# Patient Record
Sex: Male | Born: 1965 | Race: White | Hispanic: No | Marital: Married | Smoking: Former smoker
Health system: Southern US, Community
[De-identification: ages and names within clinical notes are randomized; demographics above are authoritative.]

## PROBLEM LIST (undated history)

## (undated) ENCOUNTER — Inpatient Hospital Stay (HOSPITAL_COMMUNITY): Payer: BC Managed Care – PPO

## (undated) DIAGNOSIS — J45909 Unspecified asthma, uncomplicated: Secondary | ICD-10-CM

## (undated) DIAGNOSIS — N400 Enlarged prostate without lower urinary tract symptoms: Secondary | ICD-10-CM

## (undated) DIAGNOSIS — Z87442 Personal history of urinary calculi: Secondary | ICD-10-CM

## (undated) DIAGNOSIS — N289 Disorder of kidney and ureter, unspecified: Secondary | ICD-10-CM

## (undated) DIAGNOSIS — G629 Polyneuropathy, unspecified: Secondary | ICD-10-CM

## (undated) DIAGNOSIS — I4891 Unspecified atrial fibrillation: Secondary | ICD-10-CM

## (undated) DIAGNOSIS — I4819 Other persistent atrial fibrillation: Secondary | ICD-10-CM

## (undated) DIAGNOSIS — E669 Obesity, unspecified: Secondary | ICD-10-CM

## (undated) HISTORY — DX: Polyneuropathy, unspecified: G62.9

## (undated) HISTORY — DX: Benign prostatic hyperplasia without lower urinary tract symptoms: N40.0

## (undated) HISTORY — PX: COLONOSCOPY: SHX174

## (undated) HISTORY — PX: STENT PLACE LEFT URETER (ARMC HX): HXRAD1254

## (undated) HISTORY — PX: LITHOTRIPSY: SUR834

## (undated) HISTORY — DX: Unspecified asthma, uncomplicated: J45.909

## (undated) HISTORY — DX: Other persistent atrial fibrillation: I48.19

## (undated) HISTORY — DX: Obesity, unspecified: E66.9

---

## 1998-02-17 ENCOUNTER — Ambulatory Visit (HOSPITAL_COMMUNITY): Admission: RE | Admit: 1998-02-17 | Discharge: 1998-02-17 | Payer: Self-pay | Admitting: Urology

## 2000-06-13 ENCOUNTER — Ambulatory Visit (HOSPITAL_COMMUNITY): Admission: RE | Admit: 2000-06-13 | Discharge: 2000-06-13 | Payer: Self-pay | Admitting: Urology

## 2000-06-13 ENCOUNTER — Encounter: Payer: Self-pay | Admitting: Urology

## 2002-11-05 ENCOUNTER — Ambulatory Visit (HOSPITAL_BASED_OUTPATIENT_CLINIC_OR_DEPARTMENT_OTHER): Admission: RE | Admit: 2002-11-05 | Discharge: 2002-11-05 | Payer: Self-pay | Admitting: Urology

## 2002-11-05 ENCOUNTER — Encounter: Payer: Self-pay | Admitting: Urology

## 2003-06-26 ENCOUNTER — Observation Stay (HOSPITAL_COMMUNITY): Admission: AD | Admit: 2003-06-26 | Discharge: 2003-06-27 | Payer: Self-pay | Admitting: Urology

## 2003-06-29 ENCOUNTER — Emergency Department (HOSPITAL_COMMUNITY): Admission: EM | Admit: 2003-06-29 | Discharge: 2003-06-29 | Payer: Self-pay | Admitting: Emergency Medicine

## 2003-06-30 ENCOUNTER — Ambulatory Visit (HOSPITAL_BASED_OUTPATIENT_CLINIC_OR_DEPARTMENT_OTHER): Admission: RE | Admit: 2003-06-30 | Discharge: 2003-06-30 | Payer: Self-pay | Admitting: Urology

## 2003-07-05 ENCOUNTER — Ambulatory Visit (HOSPITAL_COMMUNITY): Admission: RE | Admit: 2003-07-05 | Discharge: 2003-07-05 | Payer: Self-pay | Admitting: Urology

## 2008-03-27 ENCOUNTER — Emergency Department (HOSPITAL_COMMUNITY): Admission: EM | Admit: 2008-03-27 | Discharge: 2008-03-28 | Payer: Self-pay | Admitting: Emergency Medicine

## 2008-03-29 ENCOUNTER — Inpatient Hospital Stay (HOSPITAL_COMMUNITY): Admission: EM | Admit: 2008-03-29 | Discharge: 2008-03-29 | Payer: Self-pay | Admitting: Emergency Medicine

## 2008-04-01 ENCOUNTER — Ambulatory Visit (HOSPITAL_COMMUNITY): Admission: RE | Admit: 2008-04-01 | Discharge: 2008-04-01 | Payer: Self-pay | Admitting: Urology

## 2011-03-06 NOTE — Op Note (Signed)
Dennis [name], Dennis [name]               ACCOUNT NO.:  [id number]   MEDICAL RECORD NO.:  [id number]          PATIENT TYPE:  INP   LOCATION:  1411                         FACILITY:  [facility]   PHYSICIAN:  Mark C. Vernie Ammons, M.D.  DATE OF BIRTH:  1966-01-15   DATE OF PROCEDURE:  03/29/2008  DATE OF DISCHARGE:                               OPERATIVE REPORT   PREOPERATIVE DIAGNOSIS:  Left ureteral calculus.   POSTOPERATIVE DIAGNOSIS:  Left ureteral calculus.   PROCEDURE:  1. Cystoscopy.  2. Left retrograde pyelogram with interpretation.  3. Left double-J stent placement.   SURGEON:  Mark C. Vernie Ammons, M.D.   ANESTHESIA:  General.   BLOOD LOSS:  None.   SPECIMENS:  None.   DRAINS:  5-French 24-cm Polaris stent (with string).   COMPLICATIONS:  None.   INDICATIONS:  The patient is a 45 year old white male who was admitted  to the hospital last night with severe left renal colic and was found to  have a large left ureteral calculus.  He was brought to the operating  room today for a double-J stent placement since he had had recent Motrin  and could not undergo lithotripsy today.  The risks, complications and  alternatives were discussed, and the patient understands and elected to  proceed.   DESCRIPTION OF OPERATION:  After informed consent, the patient was  brought to the major OR, placed on the table, administered general  anesthesia, and then moved to the dorsal lithotomy position.  His  genitalia was then sterilely prepped and draped.  An official time-out  was then performed.   The 22-French cystoscope was then introduced per urethra under direct  vision.  The urethra was noted be normal down to the sphincter which  appears intact.  The prostatic urethra revealed no lesions.  Upon  entering the bladder, the bladder was fully and systematically inspected  and noted be free of any tumor, stones or inflammatory lesions.  The  ureteral orifices were of normal configuration and  position.   A 6-French open-ended ureteral catheter was then passed through the  cystoscope and into the left ureteral orifice.  Under direct  fluoroscopy, a retrograde pyelogram was then performed by injecting full-  strength contrast through the open-ended stent, and under direct  fluoroscopy this was visualized as it progressed up the left ureter  without abnormality until it reached an area just below the UPJ.  At  that location, the linear filling defect consistent with the stone seen  on previous CT was identified.  There was mild dilatation of the  intrarenal collecting system, but no stones were noted within the kidney  nor were there any filling defects or masses nor mass effect.   Through the open-ended catheter, a 0.038-inch floppy tip guidewire was  then passed under direct fluoroscopy into the area of the renal pelvis  and the open-ended stent removed.  The stent was then passed over the  guidewire into the area of the renal pelvis, and as the guidewire was  removed, good curl was noted in the renal pelvis.  The bladder was  drained,  and the string attached to the distal aspect of the stent was  then affixed to the dorsum of the penis.   The patient was awakened and taken to the recovery room in stable  satisfactory condition.  He tolerated procedure well.  There were no  intraoperative complications.   He will be given a prescription for Tylox, #36, for pain, Pyridium Plus,  #28, for bladder irritation, and Cipro 250 mg b.i.d., #14.  He will be  scheduled for lithotripsy on Thursday by Dr. Laverle Patter, and the patient had  requested that his stent be removed if the stone appeared to have broken  up completely.  That is why I left the tether on the stent.      Mark C. Vernie Ammons, M.D.  Electronically Signed     MCO/MEDQ  D:  03/29/2008  T:  03/29/2008  Job:  [id number]

## 2011-03-09 NOTE — Op Note (Signed)
[name], Dennis [name]                           ACCOUNT NO.:  [id number]   MEDICAL RECORD NO.:  [id number]                   PATIENT TYPE:  EMS   LOCATION:  ED                                   FACILITY:  [facility]   PHYSICIAN:  Mark C. Vernie Ammons, M.D.               DATE OF BIRTH:  1966/01/11   DATE OF PROCEDURE:  06/30/2003  DATE OF DISCHARGE:                                 OPERATIVE REPORT   PREOPERATIVE DIAGNOSIS:  Right ureteral calculus.   POSTOPERATIVE DIAGNOSIS:  Right ureteral calculus.   OPERATION PERFORMED:  Cystoscopy, right retrograde pyelogram and double-J  stent placement.   SURGEON:  Mark C. Vernie Ammons, M.D.   ANESTHESIA:  General  LMA.   SPECIMENS:  None.   ESTIMATED BLOOD LOSS:  None.   DRAINS:  6 French 28 cm double-J stent in the right ureter with string.   COMPLICATIONS:  None.   INDICATIONS FOR PROCEDURE:  The patient is a 45 year old white male who was  recently diagnosed with a right ureteral calculus.  Over the past week he  has required three trips to the emergency room for parenteral pain control.  He continues to have intermittent severe pain, difficult to control with  oral narcotics.  He was brought to the operating room for stent placement  and preparation for lithotripsy.   DESCRIPTION OF PROCEDURE:  After informed consent, patient brought to the  major operating room, placed on the table, administered general anesthesia,  then moved to the dorsal lithotomy position after which his genitalia were  sterilely prepped and draped.  A 22 French cystoscope was introduced in the  urethra and a 12 degree lens inserted.  The urethra was normal down to the  sphincter which was intact.  Prostatic urethra was without lesion.  The  bladder itself was free of any tumor, stones or inflammatory lesion.   The right ureteral orifice was identified.  The guidewire was passed into  the distal ureter and the open ended ureteral catheter passed over this.  I  removed  the guidewire and performed retrograde pyelogram which revealed the  stone in the midureter with hydronephrosis.  The stone's location was at the  lower border of L3.  I then passed the guidewire back through the open ended  ureteral catheter and removed the open ended catheter and replaced that with  a double-J stent.  This was passed into the area of the renal pelvis and the  guidewire was removed with good curl being noted in the renal pelvis and  bladder.  I then drained the bladder, removed the cystoscope and instilled  2% lidocaine jelly  in the urethra and a B&O suppository and the patient was awakened and taken  to the recovery room in stable and satisfactory condition.  The patient  tolerated the procedure well.  No intraoperative complications.  He will be  given a prescription for Pyridium  Plus, #36 and will undergo lithotripsy in  five days.                                                Mark C. Vernie Ammons, M.D.    MCO/MEDQ  D:  06/30/2003  T:  06/30/2003  Job:  [id number]

## 2011-07-19 LAB — URINALYSIS, ROUTINE W REFLEX MICROSCOPIC
Bilirubin Urine: NEGATIVE
Glucose, UA: NEGATIVE
Leukocytes, UA: NEGATIVE
Nitrite: NEGATIVE
Protein, ur: NEGATIVE
Specific Gravity, Urine: 1.031 — ABNORMAL HIGH
Urobilinogen, UA: 0.2
pH: 5.5

## 2011-07-19 LAB — CBC
HCT: 48.8
Hemoglobin: 16.9
Platelets: 275
RBC: 5.24
WBC: 11.6 — ABNORMAL HIGH

## 2011-07-19 LAB — BASIC METABOLIC PANEL
CO2: 27
Chloride: 106
Creatinine, Ser: 1.25
GFR calc Af Amer: >60
Glucose, Bld: 112 — ABNORMAL HIGH

## 2011-07-19 LAB — DIFFERENTIAL
Eosinophils Relative: 1
Lymphocytes Relative: 29
Lymphs Abs: 3.3
Monocytes Relative: 11

## 2011-07-19 LAB — HEMOGLOBIN AND HEMATOCRIT, BLOOD: HCT: 38.3 — ABNORMAL LOW

## 2011-07-19 LAB — URINE MICROSCOPIC-ADD ON

## 2016-10-31 DIAGNOSIS — Z1389 Encounter for screening for other disorder: Secondary | ICD-10-CM | POA: Diagnosis not present

## 2016-10-31 DIAGNOSIS — R3129 Other microscopic hematuria: Secondary | ICD-10-CM | POA: Diagnosis not present

## 2016-10-31 DIAGNOSIS — M545 Low back pain: Secondary | ICD-10-CM | POA: Diagnosis not present

## 2016-11-26 DIAGNOSIS — J111 Influenza due to unidentified influenza virus with other respiratory manifestations: Secondary | ICD-10-CM | POA: Diagnosis not present

## 2017-03-23 ENCOUNTER — Encounter (HOSPITAL_COMMUNITY): Payer: Self-pay | Admitting: Oncology

## 2017-03-23 ENCOUNTER — Emergency Department (HOSPITAL_COMMUNITY)
Admission: EM | Admit: 2017-03-23 | Discharge: 2017-03-24 | Disposition: A | Discharge status: Home / Self Care | Payer: BLUE CROSS/BLUE SHIELD | Attending: Emergency Medicine | Admitting: Emergency Medicine

## 2017-03-23 DIAGNOSIS — F17290 Nicotine dependence, other tobacco product, uncomplicated: Secondary | ICD-10-CM | POA: Diagnosis not present

## 2017-03-23 DIAGNOSIS — R319 Hematuria, unspecified: Secondary | ICD-10-CM | POA: Insufficient documentation

## 2017-03-23 DIAGNOSIS — N201 Calculus of ureter: Secondary | ICD-10-CM | POA: Diagnosis not present

## 2017-03-23 DIAGNOSIS — K200 Eosinophilic esophagitis: Secondary | ICD-10-CM | POA: Diagnosis not present

## 2017-03-23 DIAGNOSIS — R109 Unspecified abdominal pain: Secondary | ICD-10-CM | POA: Diagnosis not present

## 2017-03-23 DIAGNOSIS — N202 Calculus of kidney with calculus of ureter: Secondary | ICD-10-CM | POA: Diagnosis not present

## 2017-03-23 HISTORY — DX: Disorder of kidney and ureter, unspecified: N28.9

## 2017-03-23 LAB — URINALYSIS, ROUTINE W REFLEX MICROSCOPIC
BACTERIA UA: NONE SEEN
Bilirubin Urine: NEGATIVE
GLUCOSE, UA: NEGATIVE mg/dL
KETONES UR: 5 mg/dL — AB
LEUKOCYTES UA: NEGATIVE
NITRITE: NEGATIVE
PH: 5 (ref 5.0–8.0)
PROTEIN: 30 mg/dL — AB
Specific Gravity, Urine: 1.026 (ref 1.005–1.030)

## 2017-03-23 MED ORDER — ONDANSETRON HCL 4 MG/2ML IJ SOLN
4.0000 mg | Freq: Once | INTRAMUSCULAR | Status: AC
  Administered 2017-03-23: 4 mg via INTRAVENOUS
  Filled 2017-03-23: qty 2

## 2017-03-23 MED ORDER — HYDROMORPHONE HCL 1 MG/ML IJ SOLN
0.5000 mg | Freq: Once | INTRAMUSCULAR | Status: AC
  Administered 2017-03-23: 0.5 mg via INTRAVENOUS
  Filled 2017-03-23: qty 1

## 2017-03-23 MED ORDER — KETOROLAC TROMETHAMINE 30 MG/ML IJ SOLN
30.0000 mg | Freq: Once | INTRAMUSCULAR | Status: AC
  Administered 2017-03-23: 30 mg via INTRAVENOUS
  Filled 2017-03-23: qty 1

## 2017-03-23 NOTE — ED Triage Notes (Signed)
Pt c/o left sided flank pain that became intense today. Per pt over the last two weeks he has had intermittent discomfort.  Pt w/ hx of obstructing kidney stones.  States that this feels similar.  Denies urinary sx.  +Nausea.  Pt rates his pain 3/10, dull constant ache.

## 2017-03-23 NOTE — ED Provider Notes (Signed)
[facility]-EMERGENCY DEPT Provider Note   CSN: [id number] Arrival date & time: 03/23/17  2039     History   Chief Complaint Chief Complaint  Patient presents with  . Flank Pain    HPI Dennis [name] is a 51 y.o. male.  The history is provided by the patient and medical records.  Flank Pain     [age] y.o. M with hx of kidney stones, presenting to the ED with left flank pain.  Patient reports he has had some intermittent left flank pain over the past 2 weeks. States he went to play golf today and pain got worse after he returned home. States pain was initially at a 3/10 but has increased to 6/10.  States pain mostly in the left flank, no significant radiation. Does report decreased urine output and has noticed some hematuria. He denies any dysuria. No nausea or vomiting currently. Does have a history of recurrent obstructive stones requiring stenting or basket retrieval. He is well-known to [facility], Dr. Vernie Ammonsttelin.  Past Medical History:  Diagnosis Date  . Renal disorder    kidney stones    There are no active problems to display for this patient.   Past Surgical History:  Procedure Laterality Date  . LITHOTRIPSY    . STENT PLACE LEFT URETER ([facility] HX) Bilateral        Home Medications    Prior to Admission medications   Not on File    Family History No family history on file.  Social History Social History  Substance Use Topics  . Smoking status: Current Some Day Smoker    Types: Cigars  . Smokeless tobacco: Never Used     Comment: Every once in a while  . Alcohol use Yes     Comment: socially     Allergies   Patient has no known allergies.   Review of Systems Review of Systems  Genitourinary: Positive for flank pain and hematuria.  All other systems reviewed and are negative.    Physical Exam Updated Vital Signs BP (!) 116/99 (BP Location: Left Arm)   Pulse 84   Temp 98.4 F (36.9 C) (Oral)   Resp 20   Ht 6\' 2"  (1.88 m)   Wt 106.6 kg (235  lb)   SpO2 96%   BMI 30.17 kg/m   Physical Exam  Constitutional: He is oriented to person, place, and time. He appears well-developed and well-nourished.  HENT:  Head: Normocephalic and atraumatic.  Mouth/Throat: Oropharynx is clear and moist.  Eyes: Conjunctivae and EOM are normal. Pupils are equal, round, and reactive to light.  Neck: Normal range of motion.  Cardiovascular: Normal rate, regular rhythm and normal heart sounds.   Pulmonary/Chest: Effort normal and breath sounds normal. No respiratory distress. He has no wheezes.  Abdominal: Soft. Bowel sounds are normal. There is no tenderness. There is CVA tenderness (left). There is no rebound.  Musculoskeletal: Normal range of motion.  Neurological: He is alert and oriented to person, place, and time.  Skin: Skin is warm and dry.  Psychiatric: He has a normal mood and affect.  Nursing note and vitals reviewed.    ED Treatments / Results  Labs (all labs ordered are listed, but only abnormal results are displayed) Labs Reviewed  URINALYSIS, ROUTINE W REFLEX MICROSCOPIC - Abnormal; Notable for the following:       Result Value   APPearance HAZY (*)    Hgb urine dipstick MODERATE (*)    Ketones, ur 5 (*)  Protein, ur 30 (*)    Squamous Epithelial / LPF 0-5 (*)    All other components within normal limits  CBC WITH DIFFERENTIAL/PLATELET  BASIC METABOLIC PANEL    EKG  EKG Interpretation None       Radiology Ct Renal Stone Study  Result Date: 03/24/2017 CLINICAL DATA:  Left-sided flank pain EXAM: CT ABDOMEN AND PELVIS WITHOUT CONTRAST TECHNIQUE: Multidetector CT imaging of the abdomen and pelvis was performed following the standard protocol without IV contrast. COMPARISON:  01/01/2011 FINDINGS: Lower chest: No acute abnormality. Hepatobiliary: 2 cm cyst posterior right hepatic lobe. No calcified gallstones or biliary dilatation. Pancreas: Unremarkable. No pancreatic ductal dilatation or surrounding inflammatory changes.  Spleen: Normal in size without focal abnormality. Adrenals/Urinary Tract: Adrenal glands are within normal limits. 4 mm nonobstructing stone upper pole right kidney. Moderate left hydronephrosis and hydroureter, secondary to a 6 mm transverse by 9 mm cranial caudad stone in the proximal left ureter at the junction of the first and middle thirds. Bladder unremarkable Stomach/Bowel: Stomach is within normal limits. Appendix appears normal. No evidence of bowel wall thickening, distention, or inflammatory changes. Vascular/Lymphatic: No significant vascular findings are present. No enlarged abdominal or pelvic lymph nodes. Reproductive: Slightly enlarged prostate Other: No free air or free fluid. Musculoskeletal: No acute or significant osseous findings. IMPRESSION: 1. Moderate left hydronephrosis and hydroureter secondary to a 6 x 9 mm stone in the proximal left ureter at the junction of the first and middle thirds. 2. Nonobstructing 4 mm stone in the right kidney 3. 2 cm right posterior hepatic lobe cyst Electronically Signed   By: Jasmine Pang M.D.   On: 03/24/2017 00:30    Procedures Procedures (including critical care time)  Medications Ordered in ED Medications  ketorolac (TORADOL) 30 MG/ML injection 30 mg (not administered)  HYDROmorphone (DILAUDID) injection 0.5 mg (not administered)  ondansetron (ZOFRAN) injection 4 mg (not administered)     Initial Impression / Assessment and Plan / ED Course  I have reviewed the triage vital signs and the nursing notes.  Pertinent labs & imaging results that were available during my care of the patient were reviewed by me and considered in my medical decision making (see chart for details).  72 -year-old male here with left flank pain. Does have a history of obstructive kidney stones. He is afebrile and nontoxic. Does have left CVA tenderness on exam. Remainder of abdomen is soft and benign.  UA with blood noted.  Lab work overall reassuring.  CT renal  study with 9x6 mm stone with hydroureter and hydronephrosis. Patient's pain has been well controlled with IV Dilaudid and for all here. Patient has required ureteral stenting and basket retrieval multiple times in the past, reports episodes of sepsis secondary to obstructive stones. Given his history and size of stone here today have discussed with urology, Dr. Sherryl Barters-- has reviewed patient info from today.  Feels he is ok to go home tonight, will have office follow-up on Monday morning.  Patient and wife comfortable with this care plan. He'll be discharged home with Percocet, Zofran, Flomax. He will call the office on Monday morning to confirm appointment.  Discussed plan with patient, he acknowledged understanding and agreed with plan of care.  Return precautions given for new or worsening symptoms.  Final Clinical Impressions(s) / ED Diagnoses   Final diagnoses:  Left ureteral stone  Hematuria, unspecified type    New Prescriptions New Prescriptions   ONDANSETRON (ZOFRAN ODT) 4 MG DISINTEGRATING TABLET  Take 1 tablet (4 mg total) by mouth every 8 (eight) hours as needed for nausea.   OXYCODONE-ACETAMINOPHEN (PERCOCET) 5-325 MG TABLET    Take 1 tablet by mouth every 4 (four) hours as needed.   TAMSULOSIN (FLOMAX) 0.4 MG CAPS CAPSULE    Take 1 capsule (0.4 mg total) by mouth daily after supper.     Garlon Hatchet, PA-C 03/24/17 1610    Rolland Porter, MD 04/05/17 [phone]

## 2017-03-24 ENCOUNTER — Emergency Department (HOSPITAL_COMMUNITY): Payer: BLUE CROSS/BLUE SHIELD

## 2017-03-24 DIAGNOSIS — K200 Eosinophilic esophagitis: Secondary | ICD-10-CM | POA: Diagnosis not present

## 2017-03-24 DIAGNOSIS — R109 Unspecified abdominal pain: Secondary | ICD-10-CM | POA: Diagnosis not present

## 2017-03-24 LAB — BASIC METABOLIC PANEL
ANION GAP: 9 (ref 5–15)
BUN: 17 mg/dL (ref 6–20)
CALCIUM: 9.3 mg/dL (ref 8.9–10.3)
CHLORIDE: 107 mmol/L (ref 101–111)
CO2: 25 mmol/L (ref 22–32)
Creatinine, Ser: 1.5 mg/dL — ABNORMAL HIGH (ref 0.61–1.24)
GFR calc Af Amer: >60 mL/min (ref >60)
GFR calc non Af Amer: 53 mL/min — ABNORMAL LOW (ref >60)
Glucose, Bld: 107 mg/dL — ABNORMAL HIGH (ref 65–99)
Potassium: 4.3 mmol/L (ref 3.5–5.1)
Sodium: 141 mmol/L (ref 135–145)

## 2017-03-24 LAB — CBC WITH DIFFERENTIAL/PLATELET
BASOS ABS: 0 10*3/uL (ref 0.0–0.1)
Basophils Relative: 0 %
Eosinophils Absolute: 0.1 10*3/uL (ref 0.0–0.7)
Eosinophils Relative: 1 %
HEMATOCRIT: 45.5 % (ref 39.0–52.0)
Hemoglobin: 15.8 g/dL (ref 13.0–17.0)
Lymphocytes Relative: 18 %
Lymphs Abs: 2.1 10*3/uL (ref 0.7–4.0)
MCH: 32.1 pg (ref 26.0–34.0)
MCHC: 34.7 g/dL (ref 30.0–36.0)
MCV: 92.5 fL (ref 78.0–100.0)
MONO ABS: 1.3 10*3/uL — AB (ref 0.1–1.0)
Monocytes Relative: 11 %
NEUTROS ABS: 8.1 10*3/uL — AB (ref 1.7–7.7)
Neutrophils Relative %: 70 %
Platelets: 250 10*3/uL (ref 150–400)
RBC: 4.92 MIL/uL (ref 4.22–5.81)
RDW: 12.8 % (ref 11.5–15.5)
WBC: 11.7 10*3/uL — ABNORMAL HIGH (ref 4.0–10.5)

## 2017-03-24 MED ORDER — ONDANSETRON 4 MG PO TBDP: 4.0000 mg | ORAL_TABLET | Freq: Three times a day (TID) | ORAL | 0 refills | Status: DC | PRN

## 2017-03-24 MED ORDER — TAMSULOSIN HCL 0.4 MG PO CAPS: 0.4000 mg | ORAL_CAPSULE | Freq: Every day | ORAL | 0 refills | Status: DC

## 2017-03-24 MED ORDER — HYDROMORPHONE HCL 1 MG/ML IJ SOLN
1.0000 mg | Freq: Once | INTRAMUSCULAR | Status: AC
  Administered 2017-03-24: 1 mg via INTRAVENOUS
  Filled 2017-03-24: qty 1

## 2017-03-24 MED ORDER — OXYCODONE-ACETAMINOPHEN 5-325 MG PO TABS: 1.0000 | ORAL_TABLET | ORAL | 0 refills | Status: DC | PRN

## 2017-03-24 NOTE — Discharge Instructions (Signed)
Take the prescribed medication as directed.  Do not drive while taking the pain medication, it can make you drowsy. Follow-up with Dr. Lavonda Jumbottellin on Monday-- call office to confirm appt time in the morning. Return to the ED for new or worsening symptoms.

## 2017-03-25 DIAGNOSIS — N202 Calculus of kidney with calculus of ureter: Secondary | ICD-10-CM | POA: Diagnosis not present

## 2017-03-26 ENCOUNTER — Encounter (HOSPITAL_COMMUNITY): Payer: Self-pay | Admitting: *Deleted

## 2017-03-26 ENCOUNTER — Other Ambulatory Visit: Payer: Self-pay | Admitting: Urology

## 2017-03-28 ENCOUNTER — Ambulatory Visit (HOSPITAL_COMMUNITY)
Admission: RE | Admit: 2017-03-28 | Discharge: 2017-03-28 | Disposition: A | Discharge status: Home / Self Care | Payer: BLUE CROSS/BLUE SHIELD | Source: Ambulatory Visit | Attending: Urology | Admitting: Urology

## 2017-03-28 ENCOUNTER — Encounter (HOSPITAL_COMMUNITY): Payer: Self-pay | Admitting: *Deleted

## 2017-03-28 ENCOUNTER — Encounter (HOSPITAL_COMMUNITY)
Admission: RE | Disposition: A | Discharge status: Home / Self Care | Payer: Self-pay | Source: Ambulatory Visit | Attending: Urology

## 2017-03-28 ENCOUNTER — Ambulatory Visit (HOSPITAL_COMMUNITY): Payer: BLUE CROSS/BLUE SHIELD

## 2017-03-28 DIAGNOSIS — N201 Calculus of ureter: Secondary | ICD-10-CM | POA: Diagnosis not present

## 2017-03-28 DIAGNOSIS — N202 Calculus of kidney with calculus of ureter: Secondary | ICD-10-CM | POA: Diagnosis not present

## 2017-03-28 HISTORY — DX: Personal history of urinary calculi: Z87.442

## 2017-03-28 HISTORY — PX: EXTRACORPOREAL SHOCK WAVE LITHOTRIPSY: SHX1557

## 2017-03-28 SURGERY — LITHOTRIPSY, ESWL
Anesthesia: LOCAL | Laterality: Left

## 2017-03-28 MED ORDER — LEVOFLOXACIN 500 MG PO TABS
500.0000 mg | ORAL_TABLET | ORAL | Status: AC
  Administered 2017-03-28: 500 mg via ORAL
  Filled 2017-03-28: qty 1

## 2017-03-28 MED ORDER — DIPHENHYDRAMINE HCL 25 MG PO CAPS
25.0000 mg | ORAL_CAPSULE | ORAL | Status: AC
  Administered 2017-03-28: 25 mg via ORAL
  Filled 2017-03-28: qty 1

## 2017-03-28 MED ORDER — DIAZEPAM 5 MG PO TABS
10.0000 mg | ORAL_TABLET | ORAL | Status: AC
  Administered 2017-03-28: 10 mg via ORAL
  Filled 2017-03-28: qty 2

## 2017-03-28 MED ORDER — SODIUM CHLORIDE 0.9 % IV SOLN
INTRAVENOUS | Status: DC
  Administered 2017-03-28: 11:00:00 via INTRAVENOUS

## 2017-03-28 SURGICAL SUPPLY — 2 items
COVER SURGICAL LIGHT HANDLE (MISCELLANEOUS) ×2 IMPLANT
TOWEL OR 17X26 10 PK STRL BLUE (TOWEL DISPOSABLE) ×2 IMPLANT

## 2017-03-28 NOTE — Interval H&P Note (Signed)
History and Physical Interval Note:  03/28/2017 12:04 PM  Dennis [name]  has presented today for surgery, with the diagnosis of LEFT URETERAL STONE  The various methods of treatment have been discussed with the patient and family. After consideration of risks, benefits and other options for treatment, the patient has consented to  Procedure(s): LEFT EXTRACORPOREAL SHOCK WAVE LITHOTRIPSY (ESWL) (Left) as a surgical intervention .  The patient's history has been reviewed, patient examined, no change in status, stable for surgery.  I have reviewed the patient's chart and labs.  Questions were answered to the patient's satisfaction.     Chelsea AusDAHLSTEDT, Kandiss Ihrig M

## 2017-03-28 NOTE — Discharge Instructions (Signed)
Moderate Conscious Sedation, Adult, Care After °These instructions provide you with information about caring for yourself after your procedure. Your health care provider may also give you more specific instructions. Your treatment has been planned according to current medical practices, but problems sometimes occur. Call your health care provider if you have any problems or questions after your procedure. °What can I expect after the procedure? °After your procedure, it is common: °To feel sleepy for several hours. °To feel clumsy and have poor balance for several hours. °To have poor judgment for several hours. °To vomit if you eat too soon. ° °Follow these instructions at home: °For at least 24 hours after the procedure: ° °Do not: °Participate in activities where you could fall or become injured. °Drive. °Use heavy machinery. °Drink alcohol. °Take sleeping pills or medicines that cause drowsiness. °Make important decisions or sign legal documents. °Take care of children on your own. °Rest. °Eating and drinking °Follow the diet recommended by your health care provider. °If you vomit: °Drink water, juice, or soup when you can drink without vomiting. °Make sure you have little or no nausea before eating solid foods. °General instructions °Have a responsible adult stay with you until you are awake and alert. °Take over-the-counter and prescription medicines only as told by your health care provider. °If you smoke, do not smoke without supervision. °Keep all follow-up visits as told by your health care provider. This is important. °Contact a health care provider if: °You keep feeling nauseous or you keep vomiting. °You feel light-headed. °You develop a rash. °You have a fever. °Get help right away if: °You have trouble breathing. °This information is not intended to replace advice given to you by your health care provider. Make sure you discuss any questions you have with your health care provider. °Document Released:  07/29/2013 Document Revised: 03/12/2016 Document Reviewed: 01/28/2016 °Elsevier Interactive Patient Education © 2018 [employer] °Lithotripsy, Care After °This sheet gives you information about how to care for yourself after your procedure. Your health care provider may also give you more specific instructions. If you have problems or questions, contact your health care provider. °What can I expect after the procedure? °After the procedure, it is common to have: °· Some blood in your urine. This should only last for a few days. °· Soreness in your back, sides, or upper abdomen for a few days. °· Blotches or bruises on your back where the pressure wave entered the skin. °· Pain, discomfort, or nausea when pieces (fragments) of the kidney stone move through the tube that carries urine from the kidney to the bladder (ureter). Stone fragments may pass soon after the procedure, but they may continue to pass for up to 4-8 weeks. °? If you have severe pain or nausea, contact your health care provider. This may be caused by a large stone that was not broken up, and this may mean that you need more treatment. °· Some pain or discomfort during urination. °· Some pain or discomfort in the lower abdomen or (in men) at the base of the penis. ° °Follow these instructions at home: °Medicines °· Take over-the-counter and prescription medicines only as told by your health care provider. °· If you were prescribed an antibiotic medicine, take it as told by your health care provider. Do not stop taking the antibiotic even if you start to feel better. °· Do not drive for 24 hours if you were given a medicine to help you relax (sedative). °· Do not drive   or use heavy machinery while taking prescription pain medicine. °Eating and drinking °· Drink enough water and fluids to keep your urine clear or pale yellow. This helps any remaining pieces of the stone to pass. It can also help prevent new stones from forming. °· Eat plenty of fresh  fruits and vegetables. °· Follow instructions from your health care provider about eating and drinking restrictions. You may be instructed: °? To reduce how much salt (sodium) you eat or drink. Check ingredients and nutrition facts on packaged foods and beverages. °? To reduce how much meat you eat. °· Eat the recommended amount of calcium for your age and gender. Ask your health care provider how much calcium you should have. °General instructions °· Get plenty of rest. °· Most people can resume normal activities 1-2 days after the procedure. Ask your health care provider what activities are safe for you. °· If directed, strain all urine through the strainer that was provided by your health care provider. °? Keep all fragments for your health care provider to see. Any stones that are found may be sent to a medical lab for examination. The stone may be as small as a grain of salt. °· Keep all follow-up visits as told by your health care provider. This is important. °Contact a health care provider if: °· You have pain that is severe or does not get better with medicine. °· You have nausea that is severe or does not go away. °· You have blood in your urine longer than your health care provider told you to expect. °· You have more blood in your urine. °· You have pain during urination that does not go away. °· You urinate more frequently than usual and this does not go away. °· You develop a rash or any other possible signs of an allergic reaction. °Get help right away if: °· You have severe pain in your back, sides, or upper abdomen. °· You have severe pain while urinating. °· Your urine is very dark red. °· You have blood in your stool (feces). °· You cannot pass any urine at all. °· You feel a strong urge to urinate after emptying your bladder. °· You have a fever or chills. °· You develop shortness of breath, difficulty breathing, or chest pain. °· You have severe nausea that leads to persistent vomiting. °· You  faint. °Summary °· After this procedure, it is common to have some pain, discomfort, or nausea when pieces (fragments) of the kidney stone move through the tube that carries urine from the kidney to the bladder (ureter). If this pain or nausea is severe, however, you should contact your health care provider. °· Most people can resume normal activities 1-2 days after the procedure. Ask your health care provider what activities are safe for you. °· Drink enough water and fluids to keep your urine clear or pale yellow. This helps any remaining pieces of the stone to pass, and it can help prevent new stones from forming. °· If directed, strain your urine and keep all fragments for your health care provider to see. Fragments or stones may be as small as a grain of salt. °· Get help right away if you have severe pain in your back, sides, or upper abdomen or have severe pain while urinating. °This information is not intended to replace advice given to you by your health care provider. Make sure you discuss any questions you have with your health care provider. °Document Released: 10/28/2007 Document Revised:   08/29/2016 Document Reviewed: 08/29/2016 °Elsevier Interactive Patient Education © 2017 [employer] ° °

## 2017-03-28 NOTE — H&P (Signed)
H&P  Chief Complaint: left-sided kidney stone  History of Present Illness: 51 year old male presenting at this time for lithotripsy for a 6 x 9 mm left upper/mid ureteral stone.  He presented to my office earlier this week with symptomatic episode.  The stone is causing significant symptoms still presents for lithotripsy.  Past Medical History:  Diagnosis Date  . History of kidney stones   . Renal disorder    kidney stones    Past Surgical History:  Procedure Laterality Date  . LITHOTRIPSY    . STENT PLACE LEFT URETER ([facility] HX) Bilateral     Home Medications:  Allergies as of 03/28/2017   No Known Allergies     Medication List    Notice   Cannot display discharge medications because the patient has not yet been admitted.     Allergies: No Known Allergies  History reviewed. No pertinent family history.  Social History:  reports that he has never smoked. He has never used smokeless tobacco. He reports that he does not drink alcohol or use drugs.  ROS: A complete review of systems was performed.  All systems are negative except for pertinent findings as noted.  Physical Exam:  Vital signs in last 24 hours:   Constitutional:  Alert and oriented, No acute distress Cardiovascular: Regular rate and rhythm, No JVD Respiratory: Normal respiratory effort, Lungs clear bilaterally GI: Abdomen is soft, nontender, nondistended, no abdominal masses Lymphatic: No lymphadenopathy Neurologic: Grossly intact, no focal deficits Psychiatric: Normal mood and affect  Laboratory Data:  No results for input(s): WBC, HGB, HCT, PLT in the last 72 hours.  No results for input(s): NA, K, CL, GLUCOSE, BUN, CALCIUM, CREATININE in the last 72 hours.  Invalid input(s): CO3   No results found for this or any previous visit (from the past 24 hour(s)). No results found for this or any previous visit (from the past 240 hour(s)).  Renal Function:  Recent Labs  03/23/17 2354  CREATININE  1.50*   Estimated Creatinine Clearance: 77.5 mL/min (A) (by C-G formula based on SCr of 1.5 mg/dL (H)).  Radiologic Imaging: No results found.  Impression/Assessment:  6 x 9 mm left upper/mid ureteral stone  Plan:  Shockwave lithotripsy

## 2017-03-28 NOTE — Op Note (Signed)
See [facility] OP note scanned into chart. 

## 2017-03-29 ENCOUNTER — Encounter (HOSPITAL_COMMUNITY): Payer: Self-pay | Admitting: Urology

## 2017-04-12 DIAGNOSIS — N201 Calculus of ureter: Secondary | ICD-10-CM | POA: Diagnosis not present

## 2017-07-26 DIAGNOSIS — C44311 Basal cell carcinoma of skin of nose: Secondary | ICD-10-CM | POA: Diagnosis not present

## 2017-07-26 DIAGNOSIS — L570 Actinic keratosis: Secondary | ICD-10-CM | POA: Diagnosis not present

## 2017-10-24 ENCOUNTER — Ambulatory Visit (INDEPENDENT_AMBULATORY_CARE_PROVIDER_SITE_OTHER): Payer: BLUE CROSS/BLUE SHIELD | Admitting: Cardiology

## 2017-10-24 ENCOUNTER — Encounter: Payer: Self-pay | Admitting: Cardiology

## 2017-10-24 DIAGNOSIS — R0602 Shortness of breath: Secondary | ICD-10-CM | POA: Diagnosis not present

## 2017-10-24 DIAGNOSIS — R079 Chest pain, unspecified: Secondary | ICD-10-CM | POA: Diagnosis not present

## 2017-10-24 DIAGNOSIS — R002 Palpitations: Secondary | ICD-10-CM | POA: Diagnosis not present

## 2017-10-24 HISTORY — DX: Palpitations: R00.2

## 2017-10-24 HISTORY — DX: Chest pain, unspecified: R07.9

## 2017-10-24 HISTORY — DX: Shortness of breath: R06.02

## 2017-10-24 NOTE — Progress Notes (Signed)
Cardiology Office Note:    Date:  10/24/2017   ID:  Dennis [name], DOB [birth year], MRN [id number]  PCP:  Noni Saupe, MD  Cardiologist:  Norman Herrlich, MD   Referring MD: Noni Saupe, MD  ASSESSMENT:    1. Palpitation   2. SOB (shortness of breath)   3. Chest pain in adult    PLAN:    In order of problems listed above:  1. Symptoms are now occurring frequently enough that I think we have a good chance of catching and symptomatic events with an event monitor for the next few weeks before he travels outside of the [place].  If unsuccessful he will utilize an I phone adapter long-term as an outpatient. 2. It was set up a stress echo to evaluate for left ventricular function exercise tolerance and oxygen saturation. 3. Stress echo ordered, will try to access his previous records.  All scripts database at [facility]  Next appointment 4 weeks   Medication Adjustments/Labs and Tests Ordered: Current medicines are reviewed at length with the patient today.  Concerns regarding medicines are outlined above.  Orders Placed This Encounter  Procedures  . Cardiac event monitor  . EKG 12-Lead  . ECHOCARDIOGRAM STRESS TEST   No orders of the defined types were placed in this encounter.    Chief Complaint  Patient presents with  . Irregular Heart Beat    Onset 3 weeks  . Shortness of Breath  2 weeks  History of Present Illness:    Dennis [name] is a 52 y.o. male who is being seen today for the evaluation of the patient. His PCP isf Noni Saupe, MD. I had seen him in the past approximately 5 years ago his father had a history of aortic valve disease and valve replacement at that time he was concerned and relates that he had a normal echocardiogram.  He has been sedentary for the last 6 months under increased stress at work and notices at times that he feels short of breath at rest at times at meetings and has to take a deep breath he  also short of breath doing vigorous activity such as walking in the airport.  He has background history of asthma he is out of his inhaler and he notices that he is wheezing at times he is overdue for wellness exam will be seen in the next week by Dr. Orlinda Blalock and will have yearly labs performed.  He also has been having increasing episodes of palpitation several times a week where he notices heart skipping and racing briefly.  He has no documented history of arrhythmia takes no over-the-counter proarrhythmic medicines.  He is having no anginal discomfort but at times he gets twinges in his chest nonexertional well localized to the costochondral junction.  He has no risk factors for venous thromboembolism.  Past Medical History:  Diagnosis Date  . Asthma   . History of kidney stones   . Renal disorder    kidney stones    Past Surgical History:  Procedure Laterality Date  . EXTRACORPOREAL SHOCK WAVE LITHOTRIPSY Left 03/28/2017   Procedure: LEFT EXTRACORPOREAL SHOCK WAVE LITHOTRIPSY (ESWL);  Surgeon: Marcine Matar, MD;  Location: [facility];  Service: Urology;  Laterality: Left;  . LITHOTRIPSY    . STENT PLACE LEFT URETER ([facility] HX) Bilateral     Current Medications: No outpatient medications have been marked as taking for the 10/24/17 encounter (Office Visit) with Norman Herrlich  J, MD.     Allergies:   Patient has no known allergies.   Social History   Socioeconomic History  . Marital status: Married    Spouse name: None  . Number of children: None  . Years of education: None  . Highest education level: None  Social Needs  . Financial resource strain: None  . Food insecurity - worry: None  . Food insecurity - inability: None  . Transportation needs - medical: None  . Transportation needs - non-medical: None  Occupational History  . None  Tobacco Use  . Smoking status: Never Smoker  . Smokeless tobacco: Never Used  Substance and Sexual Activity  . Alcohol use: No    Comment:  socially  . Drug use: No  . Sexual activity: Yes  Other Topics Concern  . None  Social History Narrative  . None     Family History: The patient's family history includes Heart disease in his father; Lung cancer in his paternal grandfather.  ROS:   Review of Systems  Constitution: Negative.  HENT: Negative.   Eyes: Negative.   Cardiovascular: Positive for chest pain (twinges in chest) and palpitations (skips and rapid, now occurs several times a week).  Respiratory: Positive for shortness of breath (with exertion and at rest at times) and wheezing (history of asthma).   Endocrine: Negative.   Hematologic/Lymphatic: Negative.   Skin: Negative.   Musculoskeletal: Negative.   Gastrointestinal: Negative.   Genitourinary: Negative.   Neurological: Negative.   Psychiatric/Behavioral: Negative.   Allergic/Immunologic: Negative.    Please see the history of present illness.     All other systems reviewed and are negative.  EKGs/Labs/Other Studies Reviewed:    The following studies were reviewed today:   EKG:  EKG is  ordered today.  The ekg ordered today demonstrates sinus rhythm normal  Recent Labs: 03/23/2017: BUN 17; Creatinine, Ser 1.50; Hemoglobin 15.8; Platelets 250; Potassium 4.3; Sodium 141  Recent Lipid Panel No results found for: CHOL, TRIG, HDL, CHOLHDL, VLDL, LDLCALC, LDLDIRECT  Physical Exam:    VS:  BP (!) 130/100 (BP Location: Left Arm, Patient Position: Sitting, Cuff Size: Normal)   Pulse 78   Ht 6\' 2"  (1.88 m)   Wt 241 lb (109.3 kg)   SpO2 98%   BMI 30.94 kg/m     Wt Readings from Last 3 Encounters:  10/24/17 241 lb (109.3 kg)  03/28/17 228 lb (103.4 kg)  03/23/17 235 lb (106.6 kg)     GEN:  Well nourished, well developed in no acute distress HEENT: Normal NECK: No JVD; No carotid bruits LYMPHATICS: No lymphadenopathy CARDIAC: No chest wall tenderness RRR, no murmurs, rubs, gallops RESPIRATORY:  Clear to auscultation without rales, wheezing or  rhonchi  ABDOMEN: Soft, non-tender, non-distended MUSCULOSKELETAL:  No edema; No deformity  SKIN: Warm and dry NEUROLOGIC:  Alert and oriented x 3 PSYCHIATRIC:  Normal affect     Signed, Norman HerrlichBrian Anvith Mauriello, MD  10/24/2017 8:59 AM    [facility]

## 2017-10-24 NOTE — Patient Instructions (Addendum)
Medication Instructions:  Your physician recommends that you continue on your current medications as directed. Please refer to the Current Medication list given to you today.  Labwork: None  Testing/Procedures: You had an EKG today.  Your physician has requested that you have a stress echocardiogram. For further information please visit [email or web address]. Please follow instruction sheet as given.  Your physician has recommended that you wear an event monitor. Event monitors are medical devices that record the heart's electrical activity. Doctors most often us these monitors to diagnose arrhythmias. Arrhythmias are problems with the speed or rhythm of the heartbeat. The monitor is a small, portable device. You can wear one while you do your normal daily activities. This is usually used to diagnose what is causing palpitations/syncope (passing out). 2 weeks.  Follow-Up: Your physician recommends that you schedule a follow-up appointment in: 4 weeks.  Any Other Special Instructions Will Be Listed Below (If Applicable).     If you need a refill on your cardiac medications before your next appointment, please call your pharmacy.    KardiaMobile [email or web address]        FDA-cleared, clinical grade mobile EKG monitor: Lourena SimmondsKardia is the most clinically-validated mobile EKG used by the world's leading cardiac care medical professionals With Basic service, know instantly if your heart rhythm is normal or if atrial fibrillation is detected, and email the last single EKG recording to yourself or your doctor Premium service, available for purchase through the Kardia app for $9.99 per month or $99 per year, includes unlimited history and storage of your EKG recordings, a monthly EKG summary report to share with your doctor, along with the ability to track your blood pressure, activity and weight Includes one KardiaMobile phone clip FREE SHIPPING: Standard delivery 1-3  business days. Orders placed by 11:00am PST will ship that afternoon. Otherwise, will ship next business day. All orders ship via [employer] Priority Mail from [place]

## 2017-11-07 DIAGNOSIS — Z0000 Encounter for general adult medical examination without abnormal findings: Secondary | ICD-10-CM | POA: Diagnosis not present

## 2017-11-07 DIAGNOSIS — Z6830 Body mass index (BMI) 30.0-30.9, adult: Secondary | ICD-10-CM | POA: Diagnosis not present

## 2017-11-07 DIAGNOSIS — Z1331 Encounter for screening for depression: Secondary | ICD-10-CM | POA: Diagnosis not present

## 2017-11-07 DIAGNOSIS — Z1339 Encounter for screening examination for other mental health and behavioral disorders: Secondary | ICD-10-CM | POA: Diagnosis not present

## 2017-11-07 DIAGNOSIS — Z125 Encounter for screening for malignant neoplasm of prostate: Secondary | ICD-10-CM | POA: Diagnosis not present

## 2017-11-07 DIAGNOSIS — Z13220 Encounter for screening for lipoid disorders: Secondary | ICD-10-CM | POA: Diagnosis not present

## 2017-11-12 ENCOUNTER — Ambulatory Visit (HOSPITAL_BASED_OUTPATIENT_CLINIC_OR_DEPARTMENT_OTHER)
Admission: RE | Admit: 2017-11-12 | Discharge: 2017-11-12 | Disposition: A | Discharge status: Home / Self Care | Payer: BLUE CROSS/BLUE SHIELD | Source: Ambulatory Visit | Attending: Cardiology | Admitting: Cardiology

## 2017-11-12 ENCOUNTER — Ambulatory Visit: Payer: BLUE CROSS/BLUE SHIELD

## 2017-11-12 DIAGNOSIS — R002 Palpitations: Secondary | ICD-10-CM

## 2017-11-12 DIAGNOSIS — R079 Chest pain, unspecified: Secondary | ICD-10-CM | POA: Insufficient documentation

## 2017-11-12 DIAGNOSIS — R0602 Shortness of breath: Secondary | ICD-10-CM

## 2017-11-12 NOTE — Progress Notes (Signed)
Echocardiogram Echocardiogram Stress Test has been performed.  Dennis [name], [name] M 11/12/2017, 2:28 PM

## 2017-11-28 DIAGNOSIS — K648 Other hemorrhoids: Secondary | ICD-10-CM | POA: Diagnosis not present

## 2017-11-29 ENCOUNTER — Other Ambulatory Visit: Payer: Self-pay | Admitting: Cardiology

## 2017-11-29 DIAGNOSIS — I471 Supraventricular tachycardia: Secondary | ICD-10-CM

## 2017-11-29 MED ORDER — METOPROLOL SUCCINATE ER 25 MG PO TB24: 25.0000 mg | ORAL_TABLET | Freq: Every day | ORAL | 3 refills | Status: DC

## 2017-12-01 NOTE — Progress Notes (Signed)
Cardiology Office Note:    Date:  12/02/2017   ID:  Dennis SingleBruce D Schiavo, DOB 19-Apr-1966, MRN [id number]  PCP:  Noni Saupeedding, John F. II, MD  Cardiologist:  Norman HerrlichBrian Tamala Manzer, MD    Referring MD: Noni Saupeedding, John F. II, MD    ASSESSMENT:    1. Palpitation   2. SOB (shortness of breath)   3. Chest pain in adult    PLAN:    In order of problems listed above:  1. His event monitor documents SVT and he will start a low-dose of beta-blocker when he returns from business trip to [place]. 2. Clinically I suspect this is pulmonary etiology with a history of asthma likely represents the need for chronic maintenance therapy.  He will be referred to pulmonary and PFTs 3. Normal stress echo at this time I would not do any further ischemic evaluation   Next appointment: 6 weeks   Medication Adjustments/Labs and Tests Ordered: Current medicines are reviewed at length with the patient today.  Concerns regarding medicines are outlined above.  No orders of the defined types were placed in this encounter.  No orders of the defined types were placed in this encounter.   Chief Complaint  Patient presents with  . Follow-up  . Palpitations    History of Present Illness:    Dennis [name] is a 52 y.o. male with a hx of palpitation  last seen one month ago.. Compliance with diet, lifestyle and medications: Yes He is brought back today to review his stress test normal event monitor with brief SVT.  I advised him to usually use a low-dose beta-blocker he wants to wait to he returns from trip to [place].  His wife is concerned about intermittent shortness of breath he has a history of asthma he is not using a bronchodilator and asked for further evaluation not set up PFTs prior to his next visit.  He is not having chest pain. Past Medical History:  Diagnosis Date  . Asthma   . History of kidney stones   . Renal disorder    kidney stones    Past Surgical History:  Procedure Laterality Date  .  EXTRACORPOREAL SHOCK WAVE LITHOTRIPSY Left 03/28/2017   Procedure: LEFT EXTRACORPOREAL SHOCK WAVE LITHOTRIPSY (ESWL);  Surgeon: Marcine Matarahlstedt, Stephen, MD;  Location: [facility];  Service: Urology;  Laterality: Left;  . LITHOTRIPSY    . STENT PLACE LEFT URETER ([facility] HX) Bilateral     Current Medications: Current Meds  Medication Sig  . PROAIR HFA 108 (90 Base) MCG/ACT inhaler INHALE 2 PUFFS BY MOUTH DAILY AS NEEDED     Allergies:   Patient has no known allergies.   Social History   Socioeconomic History  . Marital status: Married    Spouse name: None  . Number of children: None  . Years of education: None  . Highest education level: None  Social Needs  . Financial resource strain: None  . Food insecurity - worry: None  . Food insecurity - inability: None  . Transportation needs - medical: None  . Transportation needs - non-medical: None  Occupational History  . None  Tobacco Use  . Smoking status: Never Smoker  . Smokeless tobacco: Never Used  Substance and Sexual Activity  . Alcohol use: No    Comment: socially  . Drug use: No  . Sexual activity: Yes  Other Topics Concern  . None  Social History Narrative  . None     Family History: The patient's family history includes  Heart disease in his father; Lung cancer in his paternal grandfather. ROS:   Please see the history of present illness.    All other systems reviewed and are negative.  EKGs/Labs/Other Studies Reviewed:    The following studies were reviewed today:  Stress echo 11/12/17: Stress ECG conclusions: There were no stress arrhythmias or   conduction abnormalities. The stress ECG was normal  Event monitor:  Dates:                                    11/12/17 to 11/25/17 Indication:                              Palpitation Ordering;                               Dr. Dulce Sellar Interpreting:                           Dr. Dulce Sellar Baseline transmission:             Sinus rhythm Atrial arrhythmia:                     One brief run of atrial tachycardia 9 beats in duration this was asymptomatic there are no episodes of atrial fibrillation or flutter Ventricular arrhythmia:             None Conduction abnormality:          None no episodes of sinus node or AV block Bradycardia:                           Sinus bradycardia 56 bpm Symptoms:                             2 symptomatic events shortness of breath and palpitation on associated with arrhythmia Conclusion:                             Brief single episode atrial tachycardia  Recent Labs: 03/23/2017: BUN 17; Creatinine, Ser 1.50; Hemoglobin 15.8; Platelets 250; Potassium 4.3; Sodium 141  Recent Lipid Panel No results found for: CHOL, TRIG, HDL, CHOLHDL, VLDL, LDLCALC, LDLDIRECT  Physical Exam:    VS:  BP 112/84 (BP Location: Left Arm, Patient Position: Sitting, Cuff Size: Normal)   Pulse 74   Ht 6\' 2"  (1.88 m)   Wt 239 lb (108.4 kg)   SpO2 97%   BMI 30.69 kg/m     Wt Readings from Last 3 Encounters:  12/02/17 239 lb (108.4 kg)  10/24/17 241 lb (109.3 kg)  03/28/17 228 lb (103.4 kg)     GEN:  Well nourished, well developed in no acute distress HEENT: Normal NECK: No JVD; No carotid bruits LYMPHATICS: No lymphadenopathy CARDIAC: RRR, no murmurs, rubs, gallops RESPIRATORY:  Clear to auscultation without rales, wheezing or rhonchi  ABDOMEN: Soft, non-tender, non-distended MUSCULOSKELETAL:  No edema; No deformity  SKIN: Warm and dry NEUROLOGIC:  Alert and oriented x 3 PSYCHIATRIC:  Normal affect    Signed, Norman Herrlich, MD  12/02/2017 8:31 AM    [facility]

## 2017-12-02 ENCOUNTER — Encounter: Payer: Self-pay | Admitting: Cardiology

## 2017-12-02 ENCOUNTER — Ambulatory Visit: Payer: BLUE CROSS/BLUE SHIELD | Admitting: Cardiology

## 2017-12-02 VITALS — BP 112/84 | HR 74 | Ht 74.0 in | Wt 239.0 lb

## 2017-12-02 DIAGNOSIS — R0602 Shortness of breath: Secondary | ICD-10-CM

## 2017-12-02 DIAGNOSIS — R002 Palpitations: Secondary | ICD-10-CM

## 2017-12-02 DIAGNOSIS — R079 Chest pain, unspecified: Secondary | ICD-10-CM | POA: Diagnosis not present

## 2017-12-02 NOTE — Patient Instructions (Addendum)
Medication Instructions:  Your physician recommends that you continue on your current medications as directed. Please refer to the Current Medication list given to you today.  Labwork: None  Testing/Procedures: Your physician has recommended that you have a pulmonary function test. Pulmonary Function Tests are a group of tests that measure how well air moves in and out of your lungs.  Follow-Up: Your physician recommends that you schedule a follow-up appointment in: 6 weeks  Referral to pulmonary has been made for PFT's; please expect a call from their office to schedule  Any Other Special Instructions Will Be Listed Below (If Applicable).  1. Avoid all over-the-counter antihistamines except Claritin/Loratadine and Zyrtec/Cetrizine.  2. Avoid all combination including cold sinus allergies flu decongestant and sleep medications  3. You can use Robitussin DM Mucinex and Mucinex DM for cough.  4. can use Tylenol aspirin ibuprofen and naproxen but no combinations such as sleep or sinus.   If you need a refill on your cardiac medications before your next appointment, please call your pharmacy.   [facility]  Garey Ham, RN, BSN   Pulmonary Function Tests Pulmonary function tests (PFTs) are used to measure how well your lungs work, find out what is causing your lung problems, and figure out the best treatment for you. You may have PFTs:  When you have an illness involving the lungs.  To follow changes in your lung function over time if you have a chronic lung disease.  If you are an IT trainer. This checks the effects of being exposed to chemicals over a long period of time.  To check lung function before having surgery or other procedures.  To check your lungs if you smoke.  To check if prescribed medicines or treatments are helping your lungs.  Your results will be compared to the expected lung function of someone with healthy lungs who is similar to you  in:  Age.  Gender.  Height.  Weight.  Race or ethnicity.  This is done to show how your lungs compare to normal lung function (percent predicted). This is how your health care provider knows if your lung function is normal or not. If you have had PFTs done before, your health care provider will compare your current results with past results. This shows if your lung function is better, worse, or the same as before. Tell a health care provider about:  Any allergies you have.  All medicines you are taking, including inhaler or nebulizer medicines, vitamins, herbs, eye drops, creams, and over-the-counter medicines.  Any blood disorders you have.  Any surgeries you have had, especially recent eye surgery, abdominal surgery, or chest surgery. These can make PFTs difficult or unsafe.  Any medical conditions you have, including chest pain or heart problems, tuberculosis, or respiratory infections such as pneumonia, a cold, or the flu.  Any fear of being in closed spaces (claustrophobia). Some of your tests may be in a closed space. What are the risks? Generally, this is a safe procedure. However, problems may occur, including:  Light-headedness due to over-breathing (hyperventilation).  An asthma attack from deep breathing.  A collapsed lung.  What happens before the procedure?  Take over-the-counter and prescription medicines only as told by your health care provider. If you take inhaler or nebulizer medicines, ask your health care provider which medicines you should take on the day of your testing. Some inhaler medicines may interfere with PFTs if they are taken shortly before the tests.  Follow your  health care provider's instructions on eating and drinking restrictions. This may include avoiding eating large meals and drinking alcohol before the testing.  Do not use any products that contain nicotine or tobacco, such as cigarettes and e-cigarettes. If you need help quitting, ask  your health care provider.  Wear comfortable clothing that will not interfere with breathing. What happens during the procedure?  You will be given a soft nose clip to wear. This is done so all of your breaths will go through your mouth instead of your nose.  You will be given a germ-free (sterile) mouthpiece. It will be attached to a machine that measures your breathing (spirometer).  You will be asked to do various breathing maneuvers. The maneuvers will be done by breathing in (inhaling) and breathing out (exhaling). You may be asked to repeat the maneuvers several times before the testing is done.  It is important to follow the instructions exactly to get accurate results. Make sure to blow as hard and as fast as you can when you are told to do so.  You may be given a medicine that makes the small air passages in your lungs larger (bronchodilator) after testing has been done. This medicine will make it easier for you to breathe.  The tests will be repeated after the bronchodilator has taken effect.  You will be monitored carefully during the procedure for faintness, dizziness, trouble breathing, or any other problems. The procedure may vary among health care providers and hospitals. What happens after the procedure?  It is up to you to get your test results. Ask your health care provider, or the department that is doing the tests, when your results will be ready. After you have received your test results, talk with your health care provider about treatment options, if necessary. Summary  Pulmonary function tests (PFTs) are used to measure how well your lungs work, find out what is causing your lung problems, and figure out the best treatment for you.  Wear comfortable clothing that will not interfere with breathing.  It is up to you to get your test results. After you have received them, talk with your health care provider about treatment options, if necessary. This information is  not intended to replace advice given to you by your health care provider. Make sure you discuss any questions you have with your health care provider. Document Released: 05/31/2004 Document Revised: 08/30/2016 Document Reviewed: 08/30/2016 Elsevier Interactive Patient Education  [employer]2018 [employer]

## 2017-12-15 DIAGNOSIS — Z20828 Contact with and (suspected) exposure to other viral communicable diseases: Secondary | ICD-10-CM | POA: Diagnosis not present

## 2017-12-27 DIAGNOSIS — Z1211 Encounter for screening for malignant neoplasm of colon: Secondary | ICD-10-CM | POA: Diagnosis not present

## 2017-12-27 DIAGNOSIS — D122 Benign neoplasm of ascending colon: Secondary | ICD-10-CM | POA: Diagnosis not present

## 2017-12-27 DIAGNOSIS — D124 Benign neoplasm of descending colon: Secondary | ICD-10-CM | POA: Diagnosis not present

## 2018-01-02 ENCOUNTER — Ambulatory Visit: Payer: BLUE CROSS/BLUE SHIELD | Admitting: Internal Medicine

## 2018-01-02 LAB — PULMONARY FUNCTION TEST
DL/VA % PRED: 117 %
DL/VA: 5.47 ml/min/mmHg/L
DLCO unc % pred: 128 %
DLCO unc: 41.56 ml/min/mmHg
FEF 25-75 POST: 4.34 L/s
FEF 25-75 Pre: 4.57 L/sec
FEF2575-%CHANGE-POST: -5 %
FEF2575-%PRED-POST: 126 %
FEF2575-%PRED-PRE: 132 %
FEV1-%Change-Post: 0 %
FEV1-%Pred-Post: 108 %
FEV1-%Pred-Pre: 107 %
FEV1-PRE: 4.22 L
FEV1-Post: 4.24 L
FEV1FVC-%Change-Post: 0 %
FEV1FVC-%Pred-Pre: 104 %
FEV6-%CHANGE-POST: 0 %
FEV6-%Pred-Post: 107 %
FEV6-%Pred-Pre: 106 %
FEV6-Post: 5.22 L
FEV6-Pre: 5.17 L
FEV6FVC-%Pred-Post: 104 %
FEV6FVC-%Pred-Pre: 104 %
FVC-%CHANGE-POST: 0 %
FVC-%PRED-POST: 103 %
FVC-%Pred-Pre: 102 %
FVC-PRE: 5.17 L
FVC-Post: 5.22 L
POST FEV1/FVC RATIO: 81 %
PRE FEV1/FVC RATIO: 82 %
Post FEV6/FVC ratio: 100 %
Pre FEV6/FVC Ratio: 100 %
RV % pred: 132 %
RV: 2.71 L
TLC % PRED: 114 %
TLC: 8 L

## 2018-01-02 NOTE — Progress Notes (Signed)
Patient completed full PFT today. 

## 2018-06-30 NOTE — Progress Notes (Signed)
Cardiology Office Note:    Date:  07/01/2018   ID:  Dennis [name], DOB 19-Jul-1966, MRN [id number]  PCP:  Noni Saupe, MD  Cardiologist:  Norman Herrlich, MD    Referring MD: Noni Saupe, MD    ASSESSMENT:    1. Paroxysmal SVT (supraventricular tachycardia) (HCC)    PLAN:    In order of problems listed above:  1. Tolerates his beta-blocker is had no recurrence is pleased with the quality of his life and will continue medical therapy and of breakthrough consider the merits of either an antiarrhythmic drug like flecainide referral for EP intervention   Next appointment: 6 months   Medication Adjustments/Labs and Tests Ordered: Current medicines are reviewed at length with the patient today.  Concerns regarding medicines are outlined above.  No orders of the defined types were placed in this encounter.  No orders of the defined types were placed in this encounter.   Chief Complaint  Patient presents with  . Follow-up    SVT    History of Present Illness:    Dennis [name] is a 52 y.o. male with a hx of palpitation and SVT last seen 12/02/17. Compliance with diet, lifestyle and medications: Yes  He is done well tolerates beta-blockers had no recurrence.  He asked me to review his PFTs with him that were normal.  No chest pain shortness of breath or palpitation Past Medical History:  Diagnosis Date  . Asthma   . History of kidney stones   . Renal disorder    kidney stones    Past Surgical History:  Procedure Laterality Date  . EXTRACORPOREAL SHOCK WAVE LITHOTRIPSY Left 03/28/2017   Procedure: LEFT EXTRACORPOREAL SHOCK WAVE LITHOTRIPSY (ESWL);  Surgeon: Marcine Matar, MD;  Location: [facility];  Service: Urology;  Laterality: Left;  . LITHOTRIPSY    . STENT PLACE LEFT URETER ([facility] HX) Bilateral     Current Medications: Current Meds  Medication Sig  . metoprolol succinate (TOPROL XL) 25 MG 24 hr tablet Take 1 tablet (25 mg total) by mouth daily.    Marland Kitchen PROAIR HFA 108 (90 Base) MCG/ACT inhaler INHALE 2 PUFFS BY MOUTH DAILY AS NEEDED     Allergies:   Patient has no known allergies.   Social History   Socioeconomic History  . Marital status: Married    Spouse name: Not on file  . Number of children: Not on file  . Years of education: Not on file  . Highest education level: Not on file  Occupational History  . Not on file  Social Needs  . Financial resource strain: Not on file  . Food insecurity:    Worry: Not on file    Inability: Not on file  . Transportation needs:    Medical: Not on file    Non-medical: Not on file  Tobacco Use  . Smoking status: Never Smoker  . Smokeless tobacco: Never Used  Substance and Sexual Activity  . Alcohol use: No    Comment: socially  . Drug use: No  . Sexual activity: Yes  Lifestyle  . Physical activity:    Days per week: Not on file    Minutes per session: Not on file  . Stress: Not on file  Relationships  . Social connections:    Talks on phone: Not on file    Gets together: Not on file    Attends religious service: Not on file    Active member of club or organization:  Not on file    Attends meetings of clubs or organizations: Not on file    Relationship status: Not on file  Other Topics Concern  . Not on file  Social History Narrative  . Not on file     Family History: The patient's family history includes Heart disease in his father; Lung cancer in his paternal grandfather. ROS:   Please see the history of present illness.    All other systems reviewed and are negative.  EKGs/Labs/Other Studies Reviewed:    The following studies were reviewed today:  EKG:  EKG ordered today.  The ekg ordered today demonstrates sinus rhythm normal incomplete right bundle branch block  Recent Labs: No results found for requested labs within last 8760 hours.  Recent Lipid Panel No results found for: CHOL, TRIG, HDL, CHOLHDL, VLDL, LDLCALC, LDLDIRECT  Physical Exam:    VS:  BP  118/88 (BP Location: Right Arm, Patient Position: Sitting, Cuff Size: Large)   Pulse 78   Ht 6\' 2"  (1.88 m)   Wt 245 lb 6.4 oz (111.3 kg)   SpO2 97%   BMI 31.51 kg/m     Wt Readings from Last 3 Encounters:  07/01/18 245 lb 6.4 oz (111.3 kg)  12/02/17 239 lb (108.4 kg)  10/24/17 241 lb (109.3 kg)     GEN:  Well nourished, well developed in no acute distress HEENT: Normal NECK: No JVD; No carotid bruits LYMPHATICS: No lymphadenopathy CARDIAC: RRR, no murmurs, rubs, gallops RESPIRATORY:  Clear to auscultation without rales, wheezing or rhonchi  ABDOMEN: Soft, non-tender, non-distended MUSCULOSKELETAL:  No edema; No deformity  SKIN: Warm and dry NEUROLOGIC:  Alert and oriented x 3 PSYCHIATRIC:  Normal affect    Signed, Norman Herrlich, MD  07/01/2018 4:33 PM    [facility]

## 2018-07-01 ENCOUNTER — Ambulatory Visit: Payer: BLUE CROSS/BLUE SHIELD | Admitting: Cardiology

## 2018-07-01 ENCOUNTER — Encounter: Payer: Self-pay | Admitting: Cardiology

## 2018-07-01 VITALS — BP 118/88 | HR 78 | Ht 74.0 in | Wt 245.4 lb

## 2018-07-01 DIAGNOSIS — I471 Supraventricular tachycardia: Secondary | ICD-10-CM | POA: Diagnosis not present

## 2018-07-01 MED ORDER — METOPROLOL SUCCINATE ER 25 MG PO TB24: 25.0000 mg | ORAL_TABLET | Freq: Every day | ORAL | 3 refills | Status: DC

## 2018-07-01 NOTE — Patient Instructions (Signed)
Medication Instructions:  Your physician recommends that you continue on your current medications as directed. Please refer to the Current Medication list given to you today.   Labwork: None  Testing/Procedures: You had an EKG today.   Follow-Up: Your physician wants you to follow-up in: 1 year. You will receive a reminder letter in the mail two months in advance. If you don't receive a letter, please call our office to schedule the follow-up appointment.   If you need a refill on your cardiac medications before your next appointment, please call your pharmacy.   Thank you for choosing [facility]! Catherine Lockhart, RN [phone]    

## 2018-11-16 DIAGNOSIS — J209 Acute bronchitis, unspecified: Secondary | ICD-10-CM | POA: Diagnosis not present

## 2018-11-16 DIAGNOSIS — J111 Influenza due to unidentified influenza virus with other respiratory manifestations: Secondary | ICD-10-CM | POA: Diagnosis not present

## 2018-11-16 DIAGNOSIS — R509 Fever, unspecified: Secondary | ICD-10-CM | POA: Diagnosis not present

## 2019-04-01 ENCOUNTER — Telehealth: Payer: Self-pay | Admitting: Cardiology

## 2019-04-01 NOTE — Telephone Encounter (Signed)
Virtual Visit Pre-Appointment Phone Call  "(Name), I am calling you today to discuss your upcoming appointment. We are currently trying to limit exposure to the virus that causes COVID-19 by seeing patients at home rather than in the office."  1. "What is the BEST phone number to call the day of the visit?" - include this in appointment notes  2. Do you have or have access to (through a family member/friend) a smartphone with video capability that we can use for your visit?" a. If yes - list this number in appt notes as cell (if different from BEST phone #) and list the appointment type as a VIDEO visit in appointment notes b. If no - list the appointment type as a PHONE visit in appointment notes  Confirm consent - "In the setting of the current Covid19 crisis, you are scheduled for a (phone or video) visit with your provider on (date) at (time).  Just as we do with many in-office visits, in order for you to participate in this visit, we must obtain consent.  If you'd like, I can send this to your mychart (if signed up) or email for you to review.  Otherwise, I can obtain your verbal consent now.  All virtual visits are billed to your insurance company just like a normal visit would be.  By agreeing to a virtual visit, we'd like you to understand that the technology does not allow for your provider to perform an examination, and thus may limit your provider's ability to fully assess your condition. If your provider identifies any concerns that need to be evaluated in person, we will make arrangements to do so.  Finally, though the technology is pretty good, we cannot assure that it will always work on either your or our end, and in the setting of a video visit, we may have to convert it to a phone-only visit.  In either situation, we cannot ensure that we have a secure connection.  Are you willing to proceed?" STAFF: Did the patient verbally acknowledge consent to telehealth visit? Document  YES/NO here:YES 3. Advise patient to be prepared - "Two hours prior to your appointment, go ahead and check your blood pressure, pulse, oxygen saturation, and your weight (if you have the equipment to check those) and write them all down. When your visit starts, your provider will ask you for this information. If you have an Apple Watch or Kardia device, please plan to have heart rate information ready on the day of your appointment. Please have a pen and paper handy nearby the day of the visit as well."  4. Give patient instructions for MyChart download to smartphone OR Doximity/Doxy.me as below if video visit (depending on what platform provider is using)  5. Inform patient they will receive a phone call 15 minutes prior to their appointment time (may be from unknown caller ID) so they should be prepared to answer    TELEPHONE CALL NOTE  Dennis [name] has been deemed a candidate for a follow-up tele-health visit to limit community exposure during the Covid-19 pandemic. I spoke with the patient via phone to ensure availability of phone/video source, confirm preferred email & phone number, and discuss instructions and expectations.  I reminded Dennis [name] to be prepared with any vital sign and/or heart rhythm information that could potentially be obtained via home monitoring, at the time of his visit. I reminded Dennis [name] to expect a phone call prior to his visit.  [name] 04/01/2019 10:22 AM   INSTRUCTIONS FOR DOWNLOADING THE MYCHART APP TO SMARTPHONE  - The patient must first make sure to have activated MyChart and know their login information - If Apple, go to [employer] and type in MyChart in the search bar and download the app. If Android, ask patient to go to [employer] and type in [place] in the search bar and download the app. The app is free but as with any other app downloads, their phone may require them to verify saved payment information or Apple/Android  password.  - The patient will need to then log into the app with their MyChart username and password, and select [facility] as their healthcare provider to link the account. When it is time for your visit, go to the MyChart app, find appointments, and click Begin Video Visit. Be sure to Select Allow for your device to access the Microphone and Camera for your visit. You will then be connected, and your provider will be with you shortly.  **If they have any issues connecting, or need assistance please contact MyChart service desk [phone])**  **If using a computer, in order to ensure the best quality for their visit they will need to use either of the following Internet Browsers: [employer], or Google Chrome**  IF USING DOXIMITY or DOXY.ME - The patient will receive a link just prior to their visit by text.     FULL LENGTH CONSENT FOR TELE-HEALTH VISIT   I hereby voluntarily request, consent and authorize [facility] and its employed or contracted physicians, physician assistants, nurse practitioners or other licensed health care professionals (the Practitioner), to provide me with telemedicine health care services (the Services") as deemed necessary by the treating Practitioner. I acknowledge and consent to receive the Services by the Practitioner via telemedicine. I understand that the telemedicine visit will involve communicating with the Practitioner through live audiovisual communication technology and the disclosure of certain medical information by electronic transmission. I acknowledge that I have been given the opportunity to request an in-person assessment or other available alternative prior to the telemedicine visit and am voluntarily participating in the telemedicine visit.  I understand that I have the right to withhold or withdraw my consent to the use of telemedicine in the course of my care at any time, without affecting my right to future care or treatment,  and that the Practitioner or I may terminate the telemedicine visit at any time. I understand that I have the right to inspect all information obtained and/or recorded in the course of the telemedicine visit and may receive copies of available information for a reasonable fee.  I understand that some of the potential risks of receiving the Services via telemedicine include:   Delay or interruption in medical evaluation due to technological equipment failure or disruption;  Information transmitted may not be sufficient (e.g. poor resolution of images) to allow for appropriate medical decision making by the Practitioner; and/or   In rare instances, security protocols could fail, causing a breach of personal health information.  Furthermore, I acknowledge that it is my responsibility to provide information about my medical history, conditions and care that is complete and accurate to the best of my ability. I acknowledge that Practitioner's advice, recommendations, and/or decision may be based on factors not within their control, such as incomplete or inaccurate data provided by me or distortions of diagnostic images or specimens that may result from electronic transmissions. I understand that the practice  of medicine is not an Chief Strategy Officer and that Practitioner makes no warranties or guarantees regarding treatment outcomes. I acknowledge that I will receive a copy of this consent concurrently upon execution via email to the email address I last provided but may also request a printed copy by calling the office of [facility].    I understand that my insurance will be billed for this visit.   I have read or had this consent read to me.  I understand the contents of this consent, which adequately explains the benefits and risks of the Services being provided via telemedicine.   I have been provided ample opportunity to ask questions regarding this consent and the Services and have had my questions  answered to my satisfaction.  I give my informed consent for the services to be provided through the use of telemedicine in my medical care  By participating in this telemedicine visit I agree to the above.

## 2019-04-06 NOTE — Progress Notes (Signed)
Virtual Visit via Video Note   This visit type was conducted due to national recommendations for restrictions regarding the COVID-19 Pandemic (e.g. social distancing) in an effort to limit this patient's exposure and mitigate transmission in our community.  Due to his co-morbid illnesses, this patient is at least at moderate risk for complications without adequate follow up.  This format is felt to be most appropriate for this patient at this time.  All issues noted in this document were discussed and addressed.  A limited physical exam was performed with this format.  Please refer to the patient's chart for his consent to telehealth for [facility].   Date:  04/07/2019   ID:  Dennis [name], DOB June 23, 1966, MRN [id number]  Patient Location: Home Provider Location: Office  PCP:  Angelina Sheriff, MD  Cardiologist:  Shirlee More, MD  Electrophysiologist:  None   Evaluation Performed:  Follow-Up Visit  Chief Complaint:  53 yo males PMH SVT presents for 6 month follow up with chief complaint of "weird pains in his chest".   History of Present Illness:    Dennis [name] is a 53 y.o. male with palpitations, SVT, asthma last seen 07/01/2018. Previous cardiac workup listed below:  Echo stress test 11/12/17 with normal ECG and no evidence stress-induced ischemia at 13.4 mets. EF 60% at rest. Mild MR.  Monitor 11/12/17 brief run AT, no atrial fibrillation or flutter.  Today he is unsettled. Reports a few weeks ago he had an episode of sharp, "electric" pain across his midsternal chest. Not associated with palpitations or SOB. Episode of pain repeated 3 times with subsequent times less severe. Initial episode caused alarm and him to stop what he was doing.   He reports "I just don't feel good" and reports fatigue. Does not feel his breathing is "as good as it used to be". Tolerating exercise and walked 4.5 miles Sunday with his wife. Has been working towards weight loss.  He is leaving  for [place] today for a business trip. Reviewed precautions on airline travel and recommended mask and protective glasses over his prescription eyewear. Will get EKG in [place] office prior to him leaving today. Will plan for cardiac CT for ischemic evaluation.   He reports on recurrence of palpitations or SVT.   The patient does not have symptoms concerning for COVID-19 infection (fever, chills, cough, or new shortness of breath).    Past Medical History:  Diagnosis Date  . Asthma   . History of kidney stones   . Renal disorder    kidney stones   Past Surgical History:  Procedure Laterality Date  . EXTRACORPOREAL SHOCK WAVE LITHOTRIPSY Left 03/28/2017   Procedure: LEFT EXTRACORPOREAL SHOCK WAVE LITHOTRIPSY (ESWL);  Surgeon: Franchot Gallo, MD;  Location: [facility];  Service: Urology;  Laterality: Left;  . LITHOTRIPSY    . STENT PLACE LEFT URETER ([facility] HX) Bilateral      Current Meds  Medication Sig  . metoprolol succinate (TOPROL XL) 25 MG 24 hr tablet Take 1 tablet (25 mg total) by mouth daily.  Marland Kitchen PROAIR HFA 108 (90 Base) MCG/ACT inhaler INHALE 2 PUFFS BY MOUTH DAILY AS NEEDED     Allergies:   Patient has no known allergies.   Social History   Tobacco Use  . Smoking status: Never Smoker  . Smokeless tobacco: Never Used  Substance Use Topics  . Alcohol use: No    Comment: socially  . Drug use: No     Family  Hx: The patient's family history includes Heart disease in his father; Lung cancer in his paternal grandfather.  ROS:   Please see the history of present illness.    Review of Systems  Constitution: Positive for malaise/fatigue. Negative for chills and fever.  Cardiovascular: Positive for chest pain. Negative for dyspnea on exertion, irregular heartbeat, leg swelling and palpitations.  Respiratory: Negative for cough, shortness of breath and wheezing.     All other systems reviewed and are negative.   Prior CV studies:   The following studies were reviewed  today:  Echo stress test 11/12/17 with normal ECG and no evidence stress-induced ischemia at 13.4 mets. EF 60% at rest. Mild MR.  Monitor 11/12/17 brief run AT, no atrial fibrillation or flutter.   Labs/Other Tests and Data Reviewed:    EKG:  No ECG reviewed.  Recent Labs: No results found for requested labs within last 8760 hours.   Recent Lipid Panel No results found for: CHOL, TRIG, HDL, CHOLHDL, LDLCALC, LDLDIRECT  Wt Readings from Last 3 Encounters:  04/07/19 247 lb (112 kg)  07/01/18 245 lb 6.4 oz (111.3 kg)  12/02/17 239 lb (108.4 kg)     Objective:    Vital Signs:  Ht 6\' 2"  (1.88 m)   Wt 247 lb (112 kg)   BMI 31.71 kg/m    VITAL SIGNS:  reviewed GEN:  no acute distress NEURO:  alert and oriented x 3, no obvious focal deficit PSYCH:  normal affect, worried  ASSESSMENT & PLAN:    1. Chest pain in adult - 3 back-to-back episodes of sharp "electric" midsternal chest pain a few weeks ago. Reports "just don't feel good" with fatigue and SOB. Chest pain is both typical and atypical. Stress echo 10/2017 without evidence of ischemia. Will re-initiate ischemia evaluation.  And will do an EKG today before he travels in the [place] in [place].  Cardiac CT  EKG in [place] office today prior to him leaving for business trip. 2. SVT - Denies recurrent palpitations. Continue metoprolol. 3. SOB - Reports breathing "still not as good as it used to be". Chest pain workup, as above. Noted PMH asthma.  4. Fatigue - Reports fatigue. Chest pain workup, as above. If fatigue continues consider labs (folate, B12, vitamin D, TSH) and/or changing Metoprolol to Acebutolol. 5. Covid Edu - Travelling via airline today to [place]. Recommended he carry antimicrobial wipes to clean his seat/tray area, mask, as well as eye protection on top of his prescription eyewear.   COVID-19 Education: The signs and symptoms of COVID-19 were discussed with the patient and how to seek care for  testing (follow up with PCP or arrange E-visit).  The importance of social distancing was discussed today. Discussed precautions for his airline travel today including antimicrobial wipes, mask, and safety glasses.   Time:   Today, I have spent 25 minutes with the patient with telehealth technology discussing the above problems.     Medication Adjustments/Labs and Tests Ordered: Current medicines are reviewed at length with the patient today.  Concerns regarding medicines are outlined above.   Tests Ordered: No orders of the defined types were placed in this encounter.   Medication Changes: No orders of the defined types were placed in this encounter.   Follow Up:  In Person in 4 week(s)  Signed, Norman HerrlichBrian Shacola Schussler, MD  04/07/2019 10:57 AM    [facility]

## 2019-04-07 ENCOUNTER — Encounter: Payer: Self-pay | Admitting: Cardiology

## 2019-04-07 ENCOUNTER — Telehealth (INDEPENDENT_AMBULATORY_CARE_PROVIDER_SITE_OTHER): Payer: BC Managed Care – PPO | Admitting: Cardiology

## 2019-04-07 ENCOUNTER — Ambulatory Visit (INDEPENDENT_AMBULATORY_CARE_PROVIDER_SITE_OTHER): Payer: BC Managed Care – PPO | Admitting: Cardiology

## 2019-04-07 ENCOUNTER — Other Ambulatory Visit: Payer: Self-pay

## 2019-04-07 VITALS — Ht 74.0 in | Wt 247.0 lb

## 2019-04-07 VITALS — BP 114/72 | HR 73 | Ht 74.0 in | Wt 246.8 lb

## 2019-04-07 DIAGNOSIS — Z7189 Other specified counseling: Secondary | ICD-10-CM

## 2019-04-07 DIAGNOSIS — R5383 Other fatigue: Secondary | ICD-10-CM | POA: Diagnosis not present

## 2019-04-07 DIAGNOSIS — I471 Supraventricular tachycardia, unspecified: Secondary | ICD-10-CM

## 2019-04-07 DIAGNOSIS — R0602 Shortness of breath: Secondary | ICD-10-CM | POA: Diagnosis not present

## 2019-04-07 DIAGNOSIS — R079 Chest pain, unspecified: Secondary | ICD-10-CM

## 2019-04-07 DIAGNOSIS — Z01812 Encounter for preprocedural laboratory examination: Secondary | ICD-10-CM

## 2019-04-07 HISTORY — DX: Other fatigue: R53.83

## 2019-04-07 HISTORY — DX: Supraventricular tachycardia, unspecified: I47.10

## 2019-04-07 NOTE — Progress Notes (Signed)
Cardiology Office Note:    Date:  04/07/2019   ID:  Dennis SingleBruce D Arcos, DOB 12/23/1965, MRN [id number]  PCP:  Noni Saupeedding, John F. II, MD  Cardiologist:  Gypsy Balsamobert Nasira Janusz, MD    Referring MD: Noni Saupeedding, John F. II, MD   Chief Complaint  Patient presents with  . EKG Per Dr. Dulce SellarMunley  Came just for EKG  History of Present Illness:    Dennis [name] is a 53 y.o. male was referred by Dr. Arnoldo MoraleManley to have EKG done.  Asymptomatic now but did have some atypical symptoms last week.  Past Medical History:  Diagnosis Date  . Asthma   . History of kidney stones   . Renal disorder    kidney stones    Past Surgical History:  Procedure Laterality Date  . EXTRACORPOREAL SHOCK WAVE LITHOTRIPSY Left 03/28/2017   Procedure: LEFT EXTRACORPOREAL SHOCK WAVE LITHOTRIPSY (ESWL);  Surgeon: Marcine Matarahlstedt, Stephen, MD;  Location: [facility];  Service: Urology;  Laterality: Left;  . LITHOTRIPSY    . STENT PLACE LEFT URETER ([facility] HX) Bilateral     Current Medications: Current Meds  Medication Sig  . metoprolol succinate (TOPROL XL) 25 MG 24 hr tablet Take 1 tablet (25 mg total) by mouth daily.  Marland Kitchen. PROAIR HFA 108 (90 Base) MCG/ACT inhaler INHALE 2 PUFFS BY MOUTH DAILY AS NEEDED     Allergies:   Patient has no known allergies.   Social History   Socioeconomic History  . Marital status: Married    Spouse name: Not on file  . Number of children: Not on file  . Years of education: Not on file  . Highest education level: Not on file  Occupational History  . Not on file  Social Needs  . Financial resource strain: Not on file  . Food insecurity    Worry: Not on file    Inability: Not on file  . Transportation needs    Medical: Not on file    Non-medical: Not on file  Tobacco Use  . Smoking status: Never Smoker  . Smokeless tobacco: Never Used  Substance and Sexual Activity  . Alcohol use: No    Comment: socially  . Drug use: No  . Sexual activity: Yes  Lifestyle  . Physical activity    Days per week:  Not on file    Minutes per session: Not on file  . Stress: Not on file  Relationships  . Social Musicianconnections    Talks on phone: Not on file    Gets together: Not on file    Attends religious service: Not on file    Active member of club or organization: Not on file    Attends meetings of clubs or organizations: Not on file    Relationship status: Not on file  Other Topics Concern  . Not on file  Social History Narrative  . Not on file     Family History: The patient's family history includes Heart disease in his father; Lung cancer in his paternal grandfather. ROS:   Please see the history of present illness.    All 14 point review of systems negative except as described per history of present illness  EKGs/Labs/Other Studies Reviewed:      Recent Labs: No results found for requested labs within last 8760 hours.  Recent Lipid Panel No results found for: CHOL, TRIG, HDL, CHOLHDL, VLDL, LDLCALC, LDLDIRECT  Physical Exam:    VS:  BP 114/72   Pulse 73   Ht 6\' 2"  (  1.88 m)   Wt 246 lb 12.8 oz (111.9 kg)   SpO2 98%   BMI 31.69 kg/m     Wt Readings from Last 3 Encounters:  04/07/19 246 lb 12.8 oz (111.9 kg)  04/07/19 247 lb (112 kg)  07/01/18 245 lb 6.4 oz (111.3 kg)     GEN:  Well nourished, well developed in no acute distress HEENT: Normal NECK: No JVD; No carotid bruits LYMPHATICS: No lymphadenopathy CARDIAC: RRR, no murmurs, no rubs, no gallops RESPIRATORY:  Clear to auscultation without rales, wheezing or rhonchi  ABDOMEN: Soft, non-tender, non-distended MUSCULOSKELETAL:  No edema; No deformity  SKIN: Warm and dry LOWER EXTREMITIES: no swelling NEUROLOGIC:  Alert and oriented x 3 PSYCHIATRIC:  Normal affect   ASSESSMENT:    No diagnosis found. PLAN:    In order of problems listed above:  1. Came for EKG EKG showed normal sinus rhythm normal P interval Q waves V1 V2 with poor of progression anterior precordium.  No acute ST-T segment changes.    Medication Adjustments/Labs and Tests Ordered: Current medicines are reviewed at length with the patient today.  Concerns regarding medicines are outlined above.  No orders of the defined types were placed in this encounter.  Medication changes: No orders of the defined types were placed in this encounter.   Signed, Park Liter, MD, [facility] 04/07/2019 12:22 PM    [facility]

## 2019-04-07 NOTE — Patient Instructions (Addendum)
Medication Instructions:  Your physician recommends that you continue on your current medications as directed. Please refer to the Current Medication list given to you today.  If you need a refill on your cardiac medications before your next appointment, please call your pharmacy.   Lab work: Your physician recommends that you return for lab work in:   3-7 DAYS PRIOR TO CT: BMP   If you have labs (blood work) drawn today and your tests are completely normal, you will receive your results only by: Marland Kitchen MyChart Message (if you have MyChart) OR . A paper copy in the mail If you have any lab test that is abnormal or we need to change your treatment, we will call you to review the results.  Testing/Procedures:Your physician has requested that you have cardiac CT. Cardiac computed tomography (CT) is a painless test that uses an x-ray machine to take clear, detailed pictures of your heart. For further information please visit [facility]uge[facility]tn. Please follow instruction sheet as given.   Please arrive at the [facility] main entrance of [facility] at xx:xx AM (30-45 minutes prior to test start time)  [facility] [place], [place] [address] [phone]  Proceed to the [facility] Radiology Department (First Floor).  Please follow these instructions carefully (unless otherwise directed):  Hold all erectile dysfunction medications at least 48 hours prior to test.  On the Night Before the Test: . Be sure to Drink plenty of water. . Do not consume any caffeinated/decaffeinated beverages or chocolate 12 hours prior to your test. . Do not take any antihistamines 12 hours prior to your test. . .  On the Day of the Test: . Drink plenty of water. Do not drink any water within one hour of the test. . Do not eat any food 4 hours prior to the test. . You may take your regular medications prior to the test.  . Take TOPROL XL 25 mg two hours prior to test. .                 -If HR is less than 55 BPM- No Beta Blocker                -IF HR is greater than 55 BPM and patient is less than or equal to 76 yrs old Lopressor 100mg  x1.             .    After the Test: . Drink plenty of water. . After receiving IV contrast, you may experience a mild flushed feeling. This is normal. . On occasion, you may experience a mild rash up to 24 hours after the test. This is not dangerous. If this occurs, you can take Benadryl 25 mg and increase your fluid intake. . If you experience trouble breathing, this can be serious. If it is severe call 911 IMMEDIATELY. If it is mild, please call our office..   Follow-Up: At [facility], you and your health needs are our priority.  As part of our continuing mission to provide you with exceptional heart care, we have created designated Provider Care Teams.  These Care Teams include your primary Cardiologist (physician) and Advanced Practice Providers (APPs -  Physician Assistants and Nurse Practitioners) who all work together to provide you with the care you need, when you need it. You will need a follow up appointment in 1 months.   Any Other Special Instructions Will Be Listed Below (If Applicable).

## 2019-04-08 NOTE — Addendum Note (Signed)
Addended by: Austin Miles on: 04/08/2019 10:14 AM   Modules accepted: Orders

## 2019-04-30 ENCOUNTER — Telehealth: Payer: Self-pay | Admitting: *Deleted

## 2019-04-30 NOTE — Telephone Encounter (Signed)
Left detailed message on patient's cell phone per DPR advising that a STAT BMP will be drawn on Monday morning, 05/04/2019, before his cardiac CTA per Mack Guise. Informed patient to contact our office with any further questions or concerns.

## 2019-04-30 NOTE — Telephone Encounter (Signed)
Pt called about appt for C T of heart which is scheduled for Monday 7/13 and pt has not had blood work done yet and is on vacation. Will not be back until Sat. 7/11. What does he need to do??? Please advise.

## 2019-05-04 ENCOUNTER — Encounter (HOSPITAL_COMMUNITY): Payer: Self-pay

## 2019-05-04 ENCOUNTER — Ambulatory Visit (HOSPITAL_COMMUNITY): Admission: RE | Admit: 2019-05-04 | Payer: BC Managed Care – PPO | Source: Ambulatory Visit

## 2019-05-04 ENCOUNTER — Other Ambulatory Visit: Payer: Self-pay

## 2019-05-04 ENCOUNTER — Ambulatory Visit (HOSPITAL_COMMUNITY)
Admission: RE | Admit: 2019-05-04 | Discharge: 2019-05-04 | Disposition: A | Discharge status: Home / Self Care | Payer: BC Managed Care – PPO | Source: Ambulatory Visit | Attending: Cardiology | Admitting: Cardiology

## 2019-05-04 DIAGNOSIS — R079 Chest pain, unspecified: Secondary | ICD-10-CM

## 2019-05-04 LAB — POCT I-STAT CREATININE: Creatinine, Ser: 1.1 mg/dL (ref 0.61–1.24)

## 2019-05-04 MED ORDER — METOPROLOL TARTRATE 5 MG/5ML IV SOLN
INTRAVENOUS | Status: AC
  Filled 2019-05-04: qty 10

## 2019-05-04 MED ORDER — IOHEXOL 350 MG/ML SOLN
80.0000 mL | Freq: Once | INTRAVENOUS | Status: AC | PRN
  Administered 2019-05-04: 80 mL via INTRAVENOUS

## 2019-05-04 MED ORDER — NITROGLYCERIN 0.4 MG SL SUBL
0.8000 mg | SUBLINGUAL_TABLET | Freq: Once | SUBLINGUAL | Status: AC
  Administered 2019-05-04: 0.8 mg via SUBLINGUAL
  Filled 2019-05-04: qty 25

## 2019-05-04 MED ORDER — NITROGLYCERIN 0.4 MG SL SUBL
SUBLINGUAL_TABLET | SUBLINGUAL | Status: AC
  Filled 2019-05-04: qty 2

## 2019-05-04 MED ORDER — METOPROLOL TARTRATE 5 MG/5ML IV SOLN
10.0000 mg | INTRAVENOUS | Status: DC | PRN
  Administered 2019-05-04: 09:00:00 10 mg via INTRAVENOUS
  Filled 2019-05-04: qty 10

## 2019-05-04 NOTE — Discharge Instructions (Signed)
Testing With IV Contrast Material °IV contrast material is a fluid that is used with some imaging tests. It is injected into your body through a vein. Contrast material is used when your health care providers need a detailed look at organs, tissues, or blood vessels that may not show up with the standard test. The material may be used when an X-ray, an MRI, a CT scan, or an ultrasound is done. °IV contrast material may be used for imaging tests that check: °· Muscles, skin, and fat. °· Breasts. °· Brain. °· Digestive tract. °· Heart. °· Organs such as the liver, kidneys, lungs, bladder, and many others. °· Arteries and veins. °Tell a health care provider about: °· Any allergies you have, especially an allergy to contrast material. °· All medicines you are taking, including metformin, beta blockers, NSAIDs (such as ibuprofen), interleukin-2, vitamins, herbs, eye drops, creams, and over-the-counter medicines. °· Any problems you or family members have had with the use of contrast material. °· Any blood disorders you have, such as sickle cell anemia. °· Any surgeries you have had. °· Any medical conditions you have or have had, especially alcohol abuse, dehydration, asthma, or kidney, liver, or heart problems. °· Whether you are pregnant or may be pregnant. °· Whether you are breastfeeding. Most contrast materials are safe for use in breastfeeding women. °What are the risks? °Generally, this is a safe procedure. However, problems may occur, including: °· Headache. °· Itching, skin rash, and hives. °· Nausea and vomiting. °· Allergic reactions. °· Wheezing or difficulty breathing. °· Abnormal heart rate. °· Changes in blood pressure. °· Throat swelling. °· Kidney damage. °What happens before the procedure? °Medicines °Ask your health care provider about: °· Changing or stopping your regular medicines. This is especially important if you are taking diabetes medicines or blood thinners. °· Taking medicines such as aspirin  and ibuprofen. These medicines can thin your blood. Do not take these medicines unless your health care provider tells you to take them. °· Taking over-the-counter medicines, vitamins, herbs, and supplements. °If you are at risk of having a reaction to the IV contrast material, you may be asked to take medicine before the procedure to prevent a reaction. °General instructions °· Follow instructions from your health care provider about eating or drinking restrictions. °· You may have an exam or lab tests to make sure that you can safely get IV contrast material. °· Ask if you will be given a medicine to help you relax (sedative) during the procedure. If so, plan to have someone take you home from the hospital or clinic. °What happens during the procedure? °· You may be given a sedative to help you relax. °· An IV will be inserted into one of your veins. °· Contrast material will be injected into your IV. °· You may feel warmth or flushing as the contrast material enters your bloodstream. °· You may have a metallic taste in your mouth for a few minutes. °· The needle may cause some discomfort and bruising. °· After the contrast material is in your body, the imaging test will be done. °The procedure may vary among health care providers and hospitals. °What can I expect after the procedure? °· The IV will be removed. °· You may be taken to a recovery area if sedation medicines were used. Your blood pressure, heart rate, breathing rate, and blood oxygen level will be monitored until you leave the hospital or clinic. °Follow these instructions at home: ° °· Take over-the-counter and   prescription medicines only as told by your health care provider. °? Your health care provider may tell you to not take certain medicines for a couple of days after the procedure. This is especially important if you are taking diabetes medicines. °· If you are told, drink enough fluid to keep your urine pale yellow. This will help to remove  the contrast material out of your body. °· Do not drive for 24 hours if you were given a sedative during your procedure. °· It is up to you to get the results of your procedure. Ask your health care provider, or the department that is doing the procedure, when your results will be ready. °· Keep all follow-up visits as told by your health care provider. This is important. °Contact a health care provider if: °· You have redness, swelling, or pain near your IV site. °Get help right away if: °· You have an abnormal heart rhythm. °· You have trouble breathing. °· You have: °? Chest pain. °? Pain in your back, neck, arm, jaw, or stomach. °? Nausea or sweating. °? Hives or a rash. °· You start shaking and cannot stop. °These symptoms may represent a serious problem that is an emergency. Do not wait to see if the symptoms will go away. Get medical help right away. Call your local emergency services (911 in the [place] Do not drive yourself to the hospital. °Summary °· IV contrast material may be used for imaging tests to help your health care providers see your organs and tissues more clearly. °· Tell your health care provider if you are pregnant or may be pregnant. °· During the procedure, you may feel warmth or flushing as the contrast material enters your bloodstream. °· After the procedure, drink enough fluid to keep your urine pale yellow. °This information is not intended to replace advice given to you by your health care provider. Make sure you discuss any questions you have with your health care provider. °Document Released: 09/26/2009 Document Revised: 12/25/2018 Document Reviewed: 12/25/2018 °Elsevier Patient Education © 2020 [employer] ° ° °Cardiac CT Angiogram ° °A cardiac CT angiogram is a procedure to look at the heart and the area around the heart. It may be done to help find the cause of chest pains or other symptoms of heart disease. During this procedure, a large X-ray machine, called a CT scanner,  takes detailed pictures of the heart and the surrounding area after a dye (contrast material) has been injected into blood vessels in the area. The procedure is also sometimes called a coronary CT angiogram, coronary artery scanning, or CTA. °A cardiac CT angiogram allows the health care provider to see how well blood is flowing to and from the heart. The health care provider will be able to see if there are any problems, such as: °· Blockage or narrowing of the coronary arteries in the heart. °· Fluid around the heart. °· Signs of weakness or disease in the muscles, valves, and tissues of the heart. °Tell a health care provider about: °· Any allergies you have. This is especially important if you have had a previous allergic reaction to contrast dye. °· All medicines you are taking, including vitamins, herbs, eye drops, creams, and over-the-counter medicines. °· Any blood disorders you have. °· Any surgeries you have had. °· Any medical conditions you have. °· Whether you are pregnant or may be pregnant. °· Any anxiety disorders, chronic pain, or other conditions you have that may increase your stress or prevent   you from lying still. °What are the risks? °Generally, this is a safe procedure. However, problems may occur, including: °· Bleeding. °· Infection. °· Allergic reactions to medicines or dyes. °· Damage to other structures or organs. °· Kidney damage from the dye or contrast that is used. °· Increased risk of cancer from radiation exposure. This risk is low. Talk with your health care provider about: °? The risks and benefits of testing. °? How you can receive the lowest dose of radiation. °What happens before the procedure? °· Wear comfortable clothing and remove any jewelry, glasses, dentures, and hearing aids. °· Follow instructions from your health care provider about eating and drinking. This may include: °? For 12 hours before the test -- avoid caffeine. This includes tea, coffee, soda, energy drinks,  and diet pills. Drink plenty of water or other fluids that do not have caffeine in them. Being well-hydrated can prevent complications. °? For 4-6 hours before the test -- stop eating and drinking. The contrast dye can cause nausea, but this is less likely if your stomach is empty. °· Ask your health care provider about changing or stopping your regular medicines. This is especially important if you are taking diabetes medicines, blood thinners, or medicines to treat erectile dysfunction. °What happens during the procedure? °· Hair on your chest may need to be removed so that small sticky patches called electrodes can be placed on your chest. These will transmit information that helps to monitor your heart during the test. °· An IV tube will be inserted into one of your veins. °· You might be given a medicine to control your heart rate during the test. This will help to ensure that good images are obtained. °· You will be asked to lie on an exam table. This table will slide in and out of the CT machine during the procedure. °· Contrast dye will be injected into the IV tube. You might feel warm, or you may get a metallic taste in your mouth. °· You will be given a medicine (nitroglycerin) to relax (dilate) the arteries in your heart. °· The table that you are lying on will move into the CT machine tunnel for the scan. °· The person running the machine will give you instructions while the scans are being done. You may be asked to: °? Keep your arms above your head. °? Hold your breath. °? Stay very still, even if the table is moving. °· When the scanning is complete, you will be moved out of the machine. °· The IV tube will be removed. °The procedure may vary among health care providers and hospitals. °What happens after the procedure? °· You might feel warm, or you may get a metallic taste in your mouth from the contrast dye. °· You may have a headache from the nitroglycerin. °· After the procedure, drink water or  other fluids to wash (flush) the contrast material out of your body. °· Contact a health care provider if you have any symptoms of allergy to the contrast. These symptoms include: °? Shortness of breath. °? Rash or hives. °? A racing heartbeat. °· Most people can return to their normal activities right after the procedure. Ask your health care provider what activities are safe for you. °· It is up to you to get the results of your procedure. Ask your health care provider, or the department that is doing the procedure, when your results will be ready. °Summary °· A cardiac CT angiogram is a procedure to   look at the heart and the area around the heart. It may be done to help find the cause of chest pains or other symptoms of heart disease. °· During this procedure, a large X-ray machine, called a CT scanner, takes detailed pictures of the heart and the surrounding area after a dye (contrast material) has been injected into blood vessels in the area. °· Ask your health care provider about changing or stopping your regular medicines before the procedure. This is especially important if you are taking diabetes medicines, blood thinners, or medicines to treat erectile dysfunction. °· After the procedure, drink water or other fluids to wash (flush) the contrast material out of your body. °This information is not intended to replace advice given to you by your health care provider. Make sure you discuss any questions you have with your health care provider. °Document Released: 09/20/2008 Document Revised: 09/20/2017 Document Reviewed: 08/27/2016 °Elsevier Patient Education © 2020 [employer] ° °

## 2019-05-04 NOTE — Progress Notes (Signed)
Pt tolerated exam without incident.  PIV removed and dressing placed.  Discharge instructions discussed with patient.  Pt provided with water.  Pt discharged

## 2019-05-18 ENCOUNTER — Ambulatory Visit: Payer: BC Managed Care – PPO | Admitting: Cardiology

## 2019-05-26 ENCOUNTER — Ambulatory Visit: Payer: BC Managed Care – PPO | Admitting: Cardiology

## 2019-05-26 NOTE — Progress Notes (Deleted)
Cardiology Office Note:    Date:  05/26/2019   ID:  Dennis [name], DOB Jul 30, 1966, MRN [id number]  PCP:  Angelina Sheriff, MD  Cardiologist:  Shirlee More, MD    Referring MD: Angelina Sheriff, MD    ASSESSMENT:    No diagnosis found. PLAN:    In order of problems listed above:  1. ***   Next appointment: ***   Medication Adjustments/Labs and Tests Ordered: Current medicines are reviewed at length with the patient today.  Concerns regarding medicines are outlined above.  No orders of the defined types were placed in this encounter.  No orders of the defined types were placed in this encounter.   No chief complaint on file.   History of Present Illness:    Dennis [name] is a 53 y.o. male with a hx of palpitations, SVT, asthma last seen 04/07/19. He had a monitor 11/12/17 with brief runs of AT. Echo stress test 11/12/17 with no evidence of ischemia. At his last office visit he described 3 episodes of sharp "electric" midsternal chest pain and was recommended for cardiac CT. Cardiac CT showed Ca score of 0 and no significant coronary disease noted.   Compliance with diet, lifestyle and medications: *** Past Medical History:  Diagnosis Date  . Asthma   . History of kidney stones   . Renal disorder    kidney stones    Past Surgical History:  Procedure Laterality Date  . EXTRACORPOREAL SHOCK WAVE LITHOTRIPSY Left 03/28/2017   Procedure: LEFT EXTRACORPOREAL SHOCK WAVE LITHOTRIPSY (ESWL);  Surgeon: Franchot Gallo, MD;  Location: [facility];  Service: Urology;  Laterality: Left;  . LITHOTRIPSY    . STENT PLACE LEFT URETER ([place] HX) Bilateral     Current Medications: No outpatient medications have been marked as taking for the 05/26/19 encounter (Appointment) with Richardo Priest, MD.     Allergies:   Patient has no known allergies.   Social History   Socioeconomic History  . Marital status: Married    Spouse name: Not on file  . Number of children: Not on  file  . Years of education: Not on file  . Highest education level: Not on file  Occupational History  . Not on file  Social Needs  . Financial resource strain: Not on file  . Food insecurity    Worry: Not on file    Inability: Not on file  . Transportation needs    Medical: Not on file    Non-medical: Not on file  Tobacco Use  . Smoking status: Never Smoker  . Smokeless tobacco: Never Used  Substance and Sexual Activity  . Alcohol use: No    Comment: socially  . Drug use: No  . Sexual activity: Yes  Lifestyle  . Physical activity    Days per week: Not on file    Minutes per session: Not on file  . Stress: Not on file  Relationships  . Social Herbalist on phone: Not on file    Gets together: Not on file    Attends religious service: Not on file    Active member of club or organization: Not on file    Attends meetings of clubs or organizations: Not on file    Relationship status: Not on file  Other Topics Concern  . Not on file  Social History Narrative  . Not on file     Family History: The patient's family history includes Heart disease  in his father; Lung cancer in his paternal grandfather. ROS:   Please see the history of present illness.    All other systems reviewed and are negative.  EKGs/Labs/Other Studies Reviewed:    The following studies were reviewed today. 05/04/19 Cardiac CTA IMPRESSION: 1. Coronary calcium score 0 Agatston units, suggesting low risk for future cardiac events. 2.  No significant coronary disease noted. IMPRESSION: No significant extracardiac findings.  Benign hepatic cyst.     Echo stress test 11/12/17 with normal ECG and no evidence stress-induced ischemia at 13.4 mets. EF 60% at rest. Mild MR.  Monitor 11/12/17 brief run AT, no atrial fibrillation or flutter.  EKG:  EKG ordered today and personally reviewed.  The ekg ordered today demonstrates ***  Recent Labs: 05/04/2019: Creatinine, Ser 1.10  Recent Lipid  Panel No results found for: CHOL, TRIG, HDL, CHOLHDL, VLDL, LDLCALC, LDLDIRECT  Physical Exam:    VS:  There were no vitals taken for this visit.    Wt Readings from Last 3 Encounters:  04/07/19 246 lb 12.8 oz (111.9 kg)  04/07/19 247 lb (112 kg)  07/01/18 245 lb 6.4 oz (111.3 kg)     GEN: *** Well nourished, well developed in no acute distress HEENT: Normal NECK: No JVD; No carotid bruits LYMPHATICS: No lymphadenopathy CARDIAC: ***RRR, no murmurs, rubs, gallops RESPIRATORY:  Clear to auscultation without rales, wheezing or rhonchi  ABDOMEN: Soft, non-tender, non-distended MUSCULOSKELETAL:  No edema; No deformity  SKIN: Warm and dry NEUROLOGIC:  Alert and oriented x 3 PSYCHIATRIC:  Normal affect    Signed, Norman HerrlichBrian Munley, MD  05/26/2019 8:00 AM    [facility]

## 2019-07-08 ENCOUNTER — Other Ambulatory Visit: Payer: Self-pay

## 2019-07-08 DIAGNOSIS — I471 Supraventricular tachycardia: Secondary | ICD-10-CM

## 2019-07-08 MED ORDER — METOPROLOL SUCCINATE ER 25 MG PO TB24: 25.0000 mg | ORAL_TABLET | Freq: Every day | ORAL | 0 refills | Status: DC

## 2019-08-12 ENCOUNTER — Other Ambulatory Visit: Payer: Self-pay | Admitting: Cardiology

## 2019-08-12 DIAGNOSIS — I471 Supraventricular tachycardia: Secondary | ICD-10-CM

## 2019-09-13 ENCOUNTER — Other Ambulatory Visit: Payer: Self-pay | Admitting: Cardiology

## 2019-09-13 DIAGNOSIS — I471 Supraventricular tachycardia: Secondary | ICD-10-CM

## 2019-09-14 NOTE — Telephone Encounter (Signed)
Metoprolol 2 wk refill sent to [facility] in [place]. Overdue for f/u

## 2019-10-04 ENCOUNTER — Other Ambulatory Visit: Payer: Self-pay | Admitting: Cardiology

## 2019-10-04 DIAGNOSIS — I471 Supraventricular tachycardia: Secondary | ICD-10-CM

## 2019-10-08 ENCOUNTER — Other Ambulatory Visit: Payer: Self-pay | Admitting: Cardiology

## 2019-10-08 DIAGNOSIS — I471 Supraventricular tachycardia: Secondary | ICD-10-CM

## 2019-10-24 ENCOUNTER — Other Ambulatory Visit: Payer: Self-pay | Admitting: Cardiology

## 2019-10-24 DIAGNOSIS — I471 Supraventricular tachycardia: Secondary | ICD-10-CM

## 2019-11-04 DIAGNOSIS — D485 Neoplasm of uncertain behavior of skin: Secondary | ICD-10-CM | POA: Diagnosis not present

## 2019-11-04 DIAGNOSIS — L700 Acne vulgaris: Secondary | ICD-10-CM | POA: Diagnosis not present

## 2019-11-04 DIAGNOSIS — L570 Actinic keratosis: Secondary | ICD-10-CM | POA: Diagnosis not present

## 2019-11-12 ENCOUNTER — Other Ambulatory Visit: Payer: Self-pay | Admitting: Cardiology

## 2019-11-12 DIAGNOSIS — I471 Supraventricular tachycardia: Secondary | ICD-10-CM

## 2019-11-12 DIAGNOSIS — I4719 Other supraventricular tachycardia: Secondary | ICD-10-CM

## 2019-11-17 DIAGNOSIS — Z0000 Encounter for general adult medical examination without abnormal findings: Secondary | ICD-10-CM | POA: Diagnosis not present

## 2019-11-17 DIAGNOSIS — Z1331 Encounter for screening for depression: Secondary | ICD-10-CM | POA: Diagnosis not present

## 2019-11-17 DIAGNOSIS — Z6832 Body mass index (BMI) 32.0-32.9, adult: Secondary | ICD-10-CM | POA: Diagnosis not present

## 2019-11-17 DIAGNOSIS — Z13220 Encounter for screening for lipoid disorders: Secondary | ICD-10-CM | POA: Diagnosis not present

## 2019-11-17 DIAGNOSIS — Z125 Encounter for screening for malignant neoplasm of prostate: Secondary | ICD-10-CM | POA: Diagnosis not present

## 2019-11-17 DIAGNOSIS — Z23 Encounter for immunization: Secondary | ICD-10-CM | POA: Diagnosis not present

## 2020-04-05 IMAGING — CT CT HEAR MORPH WITH CTA COR WITH SCORE WITH CA WITH CONTRAST AND
4 of 7 series · 8 of 20 positions shown, 9 images · IV contrast (APPLIED)
Comparison: CT March 24, 2017
COMPARISON: CT March 24, 2017

Addendum:
EXAM:
OVER-READ INTERPRETATION  CT CHEST

The following report is an over-read performed by radiologist Dr.
Abimelk Tiger [REDACTED] on 05/04/2019. This
over-read does not include interpretation of cardiac or coronary
anatomy or pathology. The coronary CTA interpretation by the
cardiologist is attached.
CLINICAL DATA: Chest pain
Cardiac CTA
MEDICATIONS:
Sub lingual nitro. 4mg x 2
TECHNIQUE: The patient was scanned on a Siemens [REDACTED]ice scanner. Gantry
rotation speed was 250 msecs. Collimation was 0.6 mm. A 100 kV
prospective scan was triggered in the ascending thoracic aorta at
35-75% of the R-R interval. Average HR during the scan was 60 bpm.
The 3D data set was interpreted on a dedicated work station using
MPR, MIP and VRT modes. A total of 80cc of contrast was used.

[Series 6: best diast 74 % · axial · 0.39mm/px · z∈[+79,+129]mm · 2 of 372 slices shown, 3 images]
[im 124/372  vessel]
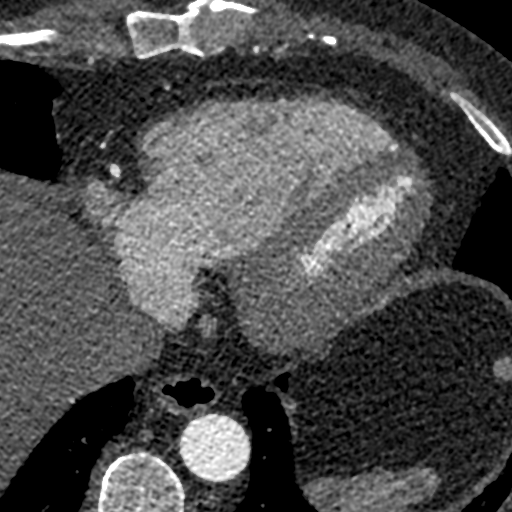
[im 124/372  lung]
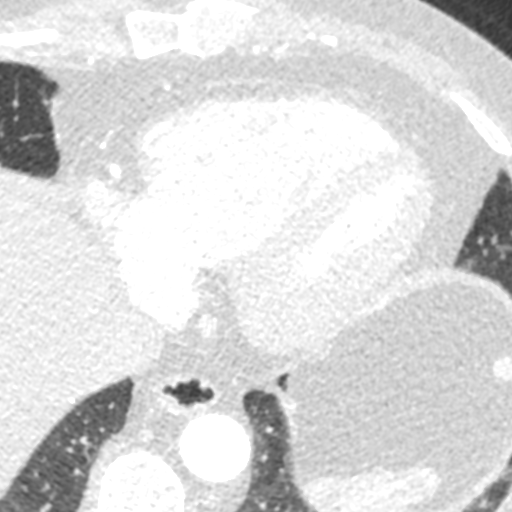
[im 248/372  vessel]
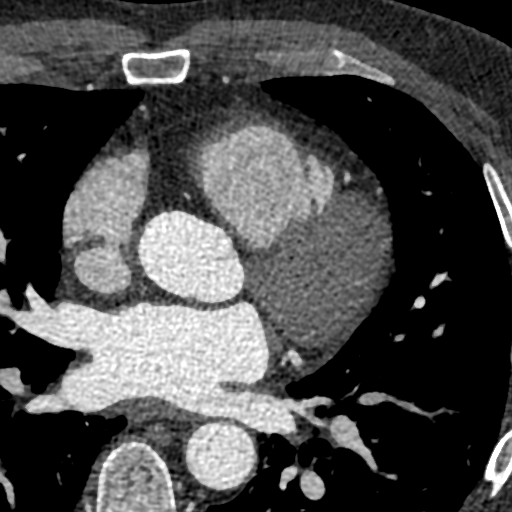

[Series 7: best syst 35 % · axial · 0.39mm/px · z∈[+79,+129]mm · 2 of 372 slices shown]
[im 124/372  vessel]
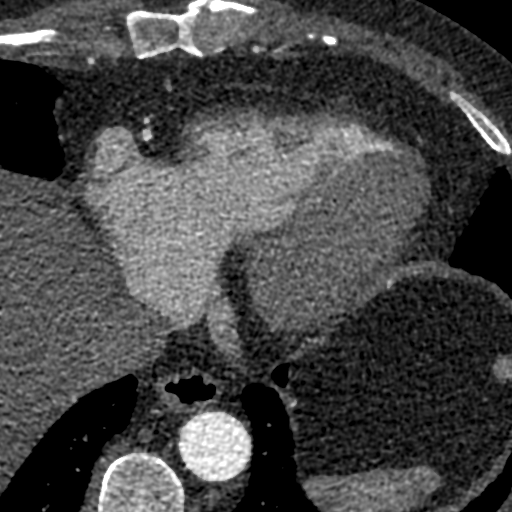
[im 248/372  vessel]
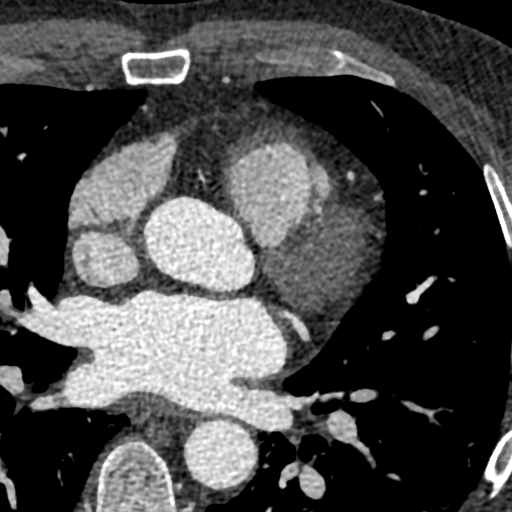

[Series 8: ts diast sharp 74 % · axial · 0.39mm/px · z∈[+79,+129]mm · 2 of 372 slices shown]
[im 124/372  lung]
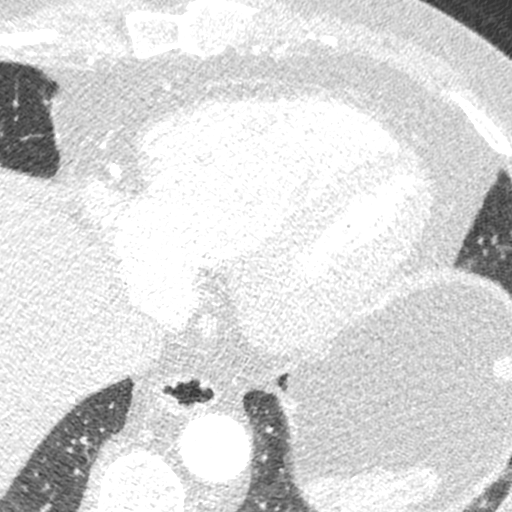
[im 248/372  lung]
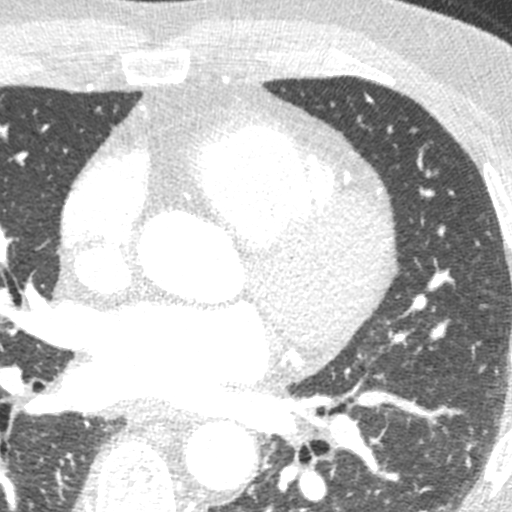

[Series 9: ts syst sharp 35 % · axial · 0.39mm/px · z∈[+79,+129]mm · 2 of 372 slices shown]
[im 124/372  lung]
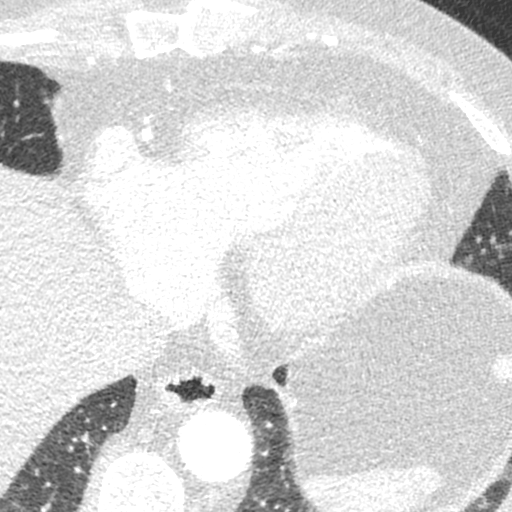
[im 248/372  lung]
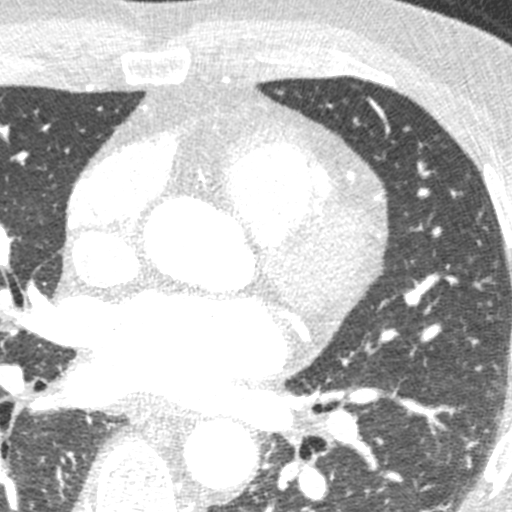

[8 of 20 positions shown; findings below may reference images not displayed]

FINDINGS: Limited view of the lung parenchyma demonstrates no suspicious
nodularity. Airways are normal.

Limited view of the mediastinum demonstrates no adenopathy.
Esophagus normal.

Limited view of the upper abdomen demonstrates a well-circumscribed
hepatic cyst in the posterior aspect the RIGHT hepatic lobe not
changed from comparison CT.

Limited view of the skeleton and chest wall is unremarkable.
IMPRESSION: No significant extracardiac findings.  Benign hepatic cyst.
FINDINGS: Non-cardiac: See separate report from [REDACTED].

2 pulmonary veins drained to left atrium on right, a single large
trunk pulmonary vein was visualized on the left draining to the left
atrium.

Calcium Score: 0 Agatston units.

Coronary Arteries: Right dominant with no anomalies

LM: No plaque or stenosis.

LAD system:  No plaque or stenosis.

Circumflex system: No plaque or stenosis.

RCA system: No plaque or stenosis.
IMPRESSION: 1. Coronary calcium score 0 Agatston units, suggesting low risk for
future cardiac events.

2.  No significant coronary disease noted.

Miriam Kuri

*** End of Addendum ***
EXAM:
OVER-READ INTERPRETATION  CT CHEST

The following report is an over-read performed by radiologist Dr.
Abimelk Tiger [REDACTED] on 05/04/2019. This
over-read does not include interpretation of cardiac or coronary
anatomy or pathology. The coronary CTA interpretation by the
cardiologist is attached.
FINDINGS: Limited view of the lung parenchyma demonstrates no suspicious
nodularity. Airways are normal.

Limited view of the mediastinum demonstrates no adenopathy.
Esophagus normal.

Limited view of the upper abdomen demonstrates a well-circumscribed
hepatic cyst in the posterior aspect the RIGHT hepatic lobe not
changed from comparison CT.

Limited view of the skeleton and chest wall is unremarkable.
IMPRESSION: No significant extracardiac findings.  Benign hepatic cyst.

## 2020-05-02 DIAGNOSIS — Z6832 Body mass index (BMI) 32.0-32.9, adult: Secondary | ICD-10-CM | POA: Diagnosis not present

## 2020-05-02 DIAGNOSIS — E7800 Pure hypercholesterolemia, unspecified: Secondary | ICD-10-CM | POA: Diagnosis not present

## 2020-05-02 DIAGNOSIS — R899 Unspecified abnormal finding in specimens from other organs, systems and tissues: Secondary | ICD-10-CM | POA: Diagnosis not present

## 2020-05-02 DIAGNOSIS — M7741 Metatarsalgia, right foot: Secondary | ICD-10-CM | POA: Diagnosis not present

## 2020-05-02 DIAGNOSIS — R202 Paresthesia of skin: Secondary | ICD-10-CM | POA: Diagnosis not present

## 2020-07-01 DIAGNOSIS — H40013 Open angle with borderline findings, low risk, bilateral: Secondary | ICD-10-CM | POA: Diagnosis not present

## 2020-07-04 ENCOUNTER — Ambulatory Visit: Payer: BC Managed Care – PPO | Admitting: Neurology

## 2020-07-04 ENCOUNTER — Ambulatory Visit (INDEPENDENT_AMBULATORY_CARE_PROVIDER_SITE_OTHER): Payer: BC Managed Care – PPO | Admitting: Neurology

## 2020-07-04 ENCOUNTER — Encounter: Payer: Self-pay | Admitting: Neurology

## 2020-07-04 ENCOUNTER — Other Ambulatory Visit: Payer: Self-pay

## 2020-07-04 VITALS — BP 122/70 | HR 88 | Ht 74.0 in | Wt 251.3 lb

## 2020-07-04 DIAGNOSIS — R200 Anesthesia of skin: Secondary | ICD-10-CM | POA: Diagnosis not present

## 2020-07-04 DIAGNOSIS — G629 Polyneuropathy, unspecified: Secondary | ICD-10-CM | POA: Diagnosis not present

## 2020-07-04 DIAGNOSIS — R202 Paresthesia of skin: Secondary | ICD-10-CM

## 2020-07-04 NOTE — Progress Notes (Signed)
Subjective:    Patient ID: Dennis [name] is a 54 y.o. male.  HPI     [name], MD, PhD [facility] [address] [address] [place], [place] [address]  Dear Dr. Jeanie Sewer,   I saw your patient, Dennis [name], upon your kind request, in my neurologic clinic today for initial consultation of his paresthesia, affecting both feet. The patient is unaccompanied today. As you know, Dennis [name] is a 54 year old Gentleman with an underlying medical history of PSVT, kidney stones, asthma, and mild obesity, who reports numbness and tingling affecting his big toe area and balls of his feet, right side more than left for the past 6+ months.  Symptoms were intermittent but seem to be more frequent, more pronounced in the right foot, with discomfort reported.  I reviewed your office records.  He had a recent right foot x-ray with benign findings per patient report.  He had some blood work through your office at the time in July with mildly elevated triglycerides and LDL per patient report.  He is not diabetic.  He has never been labeled as prediabetic.  No family history of neuropathy.  No history of back pain or radiating pain, no injuries.  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  No obvious exposure to any toxins, does not have a history of heavy alcohol use.  He drinks alcohol in the form of beer, about 3-4 beers per week on average, tries to hydrate well with water.  Does not drink daily caffeine. Symptoms appear to be more noticeable at night, sometimes he has discomfort before falling asleep but it does not necessarily prevent him from falling asleep or waking up in the middle of the night.  He tries to stay active.  He has not had any recent falls.  He has not had any weakness.  No symptoms in the upper extremities.  No facial symptoms.  His Past Medical History Is Significant For: Past Medical History:  Diagnosis Date  . Asthma   . History of kidney stones   .  Renal disorder    kidney stones    His Past Surgical History Is Significant For: Past Surgical History:  Procedure Laterality Date  . EXTRACORPOREAL SHOCK WAVE LITHOTRIPSY Left 03/28/2017   Procedure: LEFT EXTRACORPOREAL SHOCK WAVE LITHOTRIPSY (ESWL);  Surgeon: Marcine Matar, MD;  Location: [facility];  Service: Urology;  Laterality: Left;  . LITHOTRIPSY    . STENT PLACE LEFT URETER ([facility] HX) Bilateral     His Family History Is Significant For: Family History  Problem Relation Age of Onset  . Heart disease Father   . Lung cancer Paternal Grandfather     His Social History Is Significant For: Social History   Socioeconomic History  . Marital status: Married    Spouse name: Not on file  . Number of children: Not on file  . Years of education: Not on file  . Highest education level: Not on file  Occupational History  . Not on file  Tobacco Use  . Smoking status: Never Smoker  . Smokeless tobacco: Never Used  Vaping Use  . Vaping Use: Never used  Substance and Sexual Activity  . Alcohol use: No    Comment: socially  . Drug use: No  . Sexual activity: Yes  Other Topics Concern  . Not on file  Social History Narrative  . Not on file   Social Determinants of Health   Financial Resource  Strain:   . Difficulty of Paying Living Expenses: Not on file  Food Insecurity:   . Worried About Programme researcher, broadcasting/film/video in the Last Year: Not on file  . Ran Out of Food in the Last Year: Not on file  Transportation Needs:   . Lack of Transportation (Medical): Not on file  . Lack of Transportation (Non-Medical): Not on file  Physical Activity:   . Days of Exercise per Week: Not on file  . Minutes of Exercise per Session: Not on file  Stress:   . Feeling of Stress : Not on file  Social Connections:   . Frequency of Communication with Friends and Family: Not on file  . Frequency of Social Gatherings with Friends and Family: Not on file  . Attends Religious Services: Not on file  .  Active Member of Clubs or Organizations: Not on file  . Attends Banker Meetings: Not on file  . Marital Status: Not on file    His Allergies Are:  No Known Allergies:   His Current Medications Are:  Outpatient Encounter Medications as of 07/04/2020  Medication Sig  . metoprolol succinate (TOPROL-XL) 25 MG 24 hr tablet TAKE 1 TABLET (25 MG TOTAL) BY MOUTH DAILY. NO FURTHER REFILLS UNTIL SEEN BY MD  . PROAIR HFA 108 (90 Base) MCG/ACT inhaler INHALE 2 PUFFS BY MOUTH DAILY AS NEEDED   No facility-administered encounter medications on file as of 07/04/2020.  :   Review of Systems:  Out of a complete 14 point review of systems, all are reviewed and negative with the exception of these symptoms as listed below:  Review of Systems  Neurological:       Here to discuss worsening bilateral numbness/tingiling in feet. Pt reports sx started in July of this year. Pt reports sx are worse in the left foot than the right.      Objective:  Neurological Exam  Physical Exam Physical Examination:   Vitals:   07/04/20 1435  BP: 122/70  Pulse: 88  SpO2: 96%   General Examination: The patient is a very pleasant 54 y.o. male in no acute distress. He appears well-developed and well-nourished and well groomed.   HEENT: Normocephalic, atraumatic, pupils are equal, round and reactive to light and accommodation. Funduscopic exam is normal with sharp disc margins noted. Extraocular tracking is good without limitation to gaze excursion or nystagmus noted. Normal smooth pursuit is noted. Hearing is grossly intact. Face is symmetric with normal facial animation and normal facial sensation. Speech is clear with no dysarthria noted. There is no hypophonia. There is no lip, neck/head, jaw or voice tremor. Neck is supple with full range of passive and active motion. There are no carotid bruits on auscultation. Oropharynx exam reveals: mild mouth dryness, adequate dental hygiene. Tongue protrudes  centrally and palate elevates symmetrically.   Chest: Clear to auscultation without wheezing, rhonchi or crackles noted.  Heart: S1+S2+0, regular and normal without murmurs, rubs or gallops noted.   Abdomen: Soft, non-tender and non-distended with normal bowel sounds appreciated on auscultation.  Extremities: There is no pitting edema in the distal lower extremities bilaterally. Pedal pulses are intact.  Skin: Warm and dry without trophic changes noted. No varicose veins.   Musculoskeletal: exam reveals no obvious joint deformities, tenderness or joint swelling or erythema.   Neurologically:  Mental status: The patient is awake, alert and oriented in all 4 spheres. His immediate and remote memory, attention, language skills and fund of knowledge are appropriate. There  is no evidence of aphasia, agnosia, apraxia or anomia. Speech is clear with normal prosody and enunciation. Thought process is linear. Mood is normal and affect is normal.  Cranial nerves II - XII are as described above under HEENT exam. In addition: shoulder shrug is normal with equal shoulder height noted. Motor exam: Normal bulk, strength and tone is noted. There is no drift, tremor or rebound. Romberg is negative. Reflexes are 2+ throughout, in the UEs and knees, 1+ in the ankles. Babinski: Toes are flexor bilaterally. Fine motor skills and coordination: intact with normal finger taps, normal hand movements, normal rapid alternating patting, normal foot taps and normal foot agility.  Cerebellar testing: No dysmetria or intention tremor on finger to nose testing. Heel to shin is unremarkable bilaterally. There is no truncal or gait ataxia.  Sensory exam: intact to light touch, pinprick, vibration, temperature sense in the UEs and LEs with the exception of decreased sensation to pinprick and vibration sense in the distal lower extremities bilaterally, affecting the big toe area, second toe area, as well as the balls of his feet.    Gait, station and balance: He stands easily. No veering to one side is noted. No leaning to one side is noted. Posture is age-appropriate and stance is narrow based. Gait shows normal stride length and normal pace. No problems turning are noted. Tandem walk is unremarkable.   Assessment and Plan:  In summary, BLESS BELSHE is a very pleasant 54 y.o.-year old male with an underlying medical history of PSVT, kidney stones, asthma, and mild obesity, who presents for evaluation of his paresthesias and numbness affecting both feet distally with symptoms of approximately 6+ months duration, less than 1 year.  He has on examination some decrease in pinprick and vibration sense in the distal lower extremities, no obvious weakness or atrophy, no cerebellar signs, preserved fine motor skills and good balance and coordination.  His history and examination are in keeping with mild neuropathy.  No obvious cause at this point.  We will proceed with evaluation in the form of blood work and EMG nerve conduction velocity testing through our office.  We will call him with his blood test results.  He is advised that sometimes a cause for neuropathy cannot be found.  Symptomatic treatment options include medication such as gabapentin which can help in reducing discomfort and abnormal sensations but does not treat the cause of neuropathy.  The most common cause for neuropathy in this [place] is diabetes but also prediabetes can cause nerve damage.  If need be, we may resort to more in-depth evaluation and also consultation with a neuromuscular specialist down the road.  For now, we mutually agreed to proceed with laboratory work-up and electrophysiological testing in the form of EMG nerve conduction velocity testing.  I explained this test to him.  We will follow-up afterwards.  I offered a prescription for low-dose gabapentin for symptomatic treatment of his paresthesias and discomfort.  He does not have pain on a day-to-day  basis and does not have any impairment in his function at this time and declined a prescription for gabapentin.  If he changes his mind, he can always call or email [place].  I answered all his questions today and he was in agreement with the plan.   Thank you very much for allowing me to participate in the care of this nice patient. If I can be of any further assistance to you please do not hesitate to call me at [phone].  Sincerely,   Star Age, MD, PhD

## 2020-07-04 NOTE — Patient Instructions (Addendum)
You may have a condition called peripheral neuropathy, i. e. nerve damage. Unfortunately, as I mentioned, there is no specific treatment for most neuropathies. The most common cause for neuropathy is diabetes in this [place], in which case, tight glucose control is key.  Some studies suggest that obesity and prediabetes can also cause nerve damage even in the absence of a formal diagnosis of diabetes.    Other causes include thyroid disease, and some vitamin deficiencies. Certain medications such as chemotherapy agents and other chemicals or toxins including alcohol can cause neuropathy. There are some genetic conditions or hereditary neuropathies. Typically patients will report a family history of neuropathy in those conditions. There are cases associated with cancers and autoimmune conditions. Most neuropathies are progressive unless a root cause can be found and treated, which is rare, as I explained. For most neuropathies there is no actual cure or reversing of symptoms. Painful neuropathy can be difficult to treat symptomatically, but there are some medications available to ease the symptoms.  Thankfully, you have no significant pain symptoms at this time and can be monitored for symptoms.    Electrophysiologic testing with nerve conduction velocity studies and EMG (muscle testing) do not always pick up neuropathies that affect the smallest fibers. Other common tests include different type of blood work, and rarely, spinal fluid testing, and sometimes we resort to asking for a nerve and muscle biopsy.  We can also consider specialist input from a neuromuscular specialist, sometimes we make referrals to [facility] or [facility] or [facility].  For now, as discussed, we will proceed with further work-up from my end of things: We will check blood work today and call you with the test results.  We will do an EMG and nerve conduction velocity test, which is an electrical nerve and muscle test, which we  will schedule. We will call you with the results.  For symptomatic treatment, to reduce tingling or discomfort in your feet, we can consider a medication called Neurontin (gabapentin) 100 mg strength: We can start you on the lowest dose, you can also take it as needed, I would be happy to call in a prescription for you.  You could use 1 pill at night as needed. The most common side effects reported are sedation or sleepiness. Rare side effects include balance problems, confusion.

## 2020-07-06 ENCOUNTER — Ambulatory Visit: Payer: BC Managed Care – PPO | Admitting: Neurology

## 2020-07-08 LAB — RPR: RPR Ser Ql: NONREACTIVE

## 2020-07-08 LAB — MULTIPLE MYELOMA PANEL, SERUM
Albumin SerPl Elph-Mcnc: 3.6 g/dL (ref 2.9–4.4)
Albumin/Glob SerPl: 1.5 (ref 0.7–1.7)
Alpha 1: 0.2 g/dL (ref 0.0–0.4)
Alpha2 Glob SerPl Elph-Mcnc: 0.6 g/dL (ref 0.4–1.0)
B-Globulin SerPl Elph-Mcnc: 1 g/dL (ref 0.7–1.3)
Gamma Glob SerPl Elph-Mcnc: 0.7 g/dL (ref 0.4–1.8)
Globulin, Total: 2.5 g/dL (ref 2.2–3.9)
IgA/Immunoglobulin A, Serum: 117 mg/dL (ref 90–386)
IgG (Immunoglobin G), Serum: 769 mg/dL (ref 603–1613)
IgM (Immunoglobulin M), Srm: 40 mg/dL (ref 20–172)

## 2020-07-08 LAB — VITAMIN B6: Vitamin B6: 7.3 ug/L (ref 5.3–46.7)

## 2020-07-08 LAB — VITAMIN B1: Thiamine: 122.1 nmol/L (ref 66.5–200.0)

## 2020-07-08 LAB — COMPREHENSIVE METABOLIC PANEL
ALT: 23 IU/L (ref 0–44)
AST: 19 IU/L (ref 0–40)
Albumin/Globulin Ratio: 2.1 (ref 1.2–2.2)
Albumin: 4.1 g/dL (ref 3.8–4.9)
Alkaline Phosphatase: 65 IU/L (ref 44–121)
BUN/Creatinine Ratio: 11 (ref 9–20)
BUN: 12 mg/dL (ref 6–24)
Bilirubin Total: 0.4 mg/dL (ref 0.0–1.2)
CO2: 23 mmol/L (ref 20–29)
Calcium: 9.1 mg/dL (ref 8.7–10.2)
Chloride: 104 mmol/L (ref 96–106)
Creatinine, Ser: 1.11 mg/dL (ref 0.76–1.27)
GFR calc Af Amer: 87 mL/min/{1.73_m2} (ref >59)
GFR calc non Af Amer: 75 mL/min/{1.73_m2} (ref >59)
Globulin, Total: 2 g/dL (ref 1.5–4.5)
Glucose: 90 mg/dL (ref 65–99)
Potassium: 4.4 mmol/L (ref 3.5–5.2)
Sodium: 141 mmol/L (ref 134–144)
Total Protein: 6.1 g/dL (ref 6.0–8.5)

## 2020-07-08 LAB — ANA W/REFLEX: Anti Nuclear Antibody (ANA): POSITIVE — AB

## 2020-07-08 LAB — HEAVY METALS PROFILE II, BLOOD
Arsenic: 4 ug/L (ref 2–23)
Cadmium: <0.5 ug/L (ref 0.0–1.2)
Lead, Blood: 1 ug/dL (ref 0–4)
Mercury: 4.9 ug/L (ref 0.0–14.9)

## 2020-07-08 LAB — HGB A1C W/O EAG: Hgb A1c MFr Bld: 5.6 % (ref 4.8–5.6)

## 2020-07-08 LAB — SEDIMENTATION RATE: Sed Rate: 2 mm/hr (ref 0–30)

## 2020-07-08 LAB — ENA+DNA/DS+SJORGEN'S
ENA RNP Ab: 1 AI — ABNORMAL HIGH (ref 0.0–0.9)
ENA SM Ab Ser-aCnc: <0.2 AI (ref 0.0–0.9)
ENA SSA (RO) Ab: <0.2 AI (ref 0.0–0.9)
ENA SSB (LA) Ab: <0.2 AI (ref 0.0–0.9)
dsDNA Ab: <1 IU/mL (ref 0–9)

## 2020-07-08 LAB — RHEUMATOID FACTOR: Rhuematoid fact SerPl-aCnc: <10 IU/mL (ref 0.0–13.9)

## 2020-07-08 LAB — B12 AND FOLATE PANEL
Folate: 3.8 ng/mL (ref >3.0)
Vitamin B-12: 454 pg/mL (ref 232–1245)

## 2020-07-08 LAB — C-REACTIVE PROTEIN: CRP: 2 mg/L (ref 0–10)

## 2020-07-08 LAB — TSH: TSH: 0.994 u[IU]/mL (ref 0.450–4.500)

## 2020-07-14 ENCOUNTER — Telehealth: Payer: Self-pay

## 2020-07-14 DIAGNOSIS — R768 Other specified abnormal immunological findings in serum: Secondary | ICD-10-CM

## 2020-07-14 NOTE — Progress Notes (Signed)
Please call patient and advise him that his labs were fine with the exception of one autoimmune marker which is called ANA, this was positive.  Sometimes this marker is positive without any further clinical implications.  Sometimes this can indicate an underlying connective tissue disease, and can be positive in patients with lupus and rheumatoid arthritis or other forms of arthritis or autoimmune disease.  We can request input and further evaluation through her rheumatologist if he would be agreeable, it may be a one-time consultation we can certainly go that route.  If he is agreeable, please place a referral to rheumatology.  From our end, we will proceed with EMG and nerve conduction testing as scheduled for next week.

## 2020-07-14 NOTE — Telephone Encounter (Signed)
I called the pt and we reviewed the results. Pt is agreeable to Rheumatologist referral and order has been placed. Pt will keep NCS/EMG as scheduled for next week.

## 2020-07-14 NOTE — Telephone Encounter (Signed)
-----   Message from [name], MD sent at 07/14/2020  8:30 AM EDT ----- Please call patient and advise him that his labs were fine with the exception of one autoimmune marker which is called ANA, this was positive.  Sometimes this marker is positive without any further clinical implications.  Sometimes this can indicate an underlying connective tissue disease, and can be positive in patients with lupus and rheumatoid arthritis or other forms of arthritis or autoimmune disease.  We can request input and further evaluation through her rheumatologist if he would be agreeable, it may be a one-time consultation we can certainly go that route.  If he is agreeable, please place a referral to rheumatology.  From our end, we will proceed with EMG and nerve conduction testing as scheduled for next week.

## 2020-07-18 DIAGNOSIS — D225 Melanocytic nevi of trunk: Secondary | ICD-10-CM | POA: Diagnosis not present

## 2020-07-18 DIAGNOSIS — C44519 Basal cell carcinoma of skin of other part of trunk: Secondary | ICD-10-CM | POA: Diagnosis not present

## 2020-07-18 DIAGNOSIS — L570 Actinic keratosis: Secondary | ICD-10-CM | POA: Diagnosis not present

## 2020-07-20 ENCOUNTER — Other Ambulatory Visit: Payer: Self-pay

## 2020-07-20 ENCOUNTER — Encounter: Payer: Self-pay | Admitting: Neurology

## 2020-07-20 ENCOUNTER — Ambulatory Visit (INDEPENDENT_AMBULATORY_CARE_PROVIDER_SITE_OTHER): Payer: BC Managed Care – PPO | Admitting: Neurology

## 2020-07-20 DIAGNOSIS — R5383 Other fatigue: Secondary | ICD-10-CM

## 2020-07-20 DIAGNOSIS — R202 Paresthesia of skin: Secondary | ICD-10-CM | POA: Diagnosis not present

## 2020-07-20 DIAGNOSIS — R200 Anesthesia of skin: Secondary | ICD-10-CM | POA: Diagnosis not present

## 2020-07-20 DIAGNOSIS — G629 Polyneuropathy, unspecified: Secondary | ICD-10-CM

## 2020-07-20 NOTE — Progress Notes (Signed)
Please call patient regarding his EMG and nerve conduction velocity test from today.  There is evidence of mild neuropathy.  No significant evidence of any moderate or severe changes, something we can certainly monitor.  No evidence of pinched nerve type findings from the back either.  Again, on the laboratory testing there was no obvious cause for neuropathy other than mild changes that prompted [place] to seek consultation with rheumatology.  For now, we can continue to monitor his exam.  We can have him follow-up in clinic in the next 3 to 6 months, please offer an appointment so we can track his symptoms and his examination.

## 2020-07-20 NOTE — Progress Notes (Signed)
[facility]    Nerve / Sites Muscle Latency Ref. Amplitude Ref. Rel Amp Segments Distance Velocity Ref. Area    ms ms mV mV %  cm m/s m/s mVms  R Peroneal - EDB     Ankle EDB 5.3 ?6.5 5.2 ?2.0 100 Ankle - EDB 9   16.5     Fib head EDB 13.1  4.4  83.7 Fib head - Ankle 34 44 ?44 16.5     Pop fossa EDB 15.4  4.2  97.4 Pop fossa - Fib head 10 44 ?44 16.5         Pop fossa - Ankle      L Peroneal - EDB     Ankle EDB 5.1 ?6.5 5.9 ?2.0 100 Ankle - EDB 9   17.2     Fib head EDB 13.7  3.4  57.7 Fib head - Ankle 32 37 ?44 14.5     Pop fossa EDB 15.3  5.0  149 Pop fossa - Fib head 10 65 ?44 17.8         Pop fossa - Ankle      R Tibial - AH     Ankle AH 3.8 ?5.8 6.1 ?4.0 100 Ankle - AH 9   9.2     Pop fossa AH 15.1  4.0  65.2 Pop fossa - Ankle 46 41 ?41 10.8  L Tibial - AH     Ankle AH 4.8 ?5.8 6.2 ?4.0 100 Ankle - AH 9   12.7     Pop fossa AH 16.7  3.7  60.1 Pop fossa - Ankle 44 37 ?41 10.7             [facility]    Nerve / Sites Rec. Site Peak Lat Ref.  Amp Ref. Segments Distance    ms ms V V  cm  R Sural - Ankle (Calf)     Calf Ankle 3.4 ?4.4 4 ?6 Calf - Ankle 14  L Sural - Ankle (Calf)     Calf Ankle 4.0 ?4.4 4 ?6 Calf - Ankle 14  R Superficial peroneal - Ankle     Lat leg Ankle 4.2 ?4.4 3 ?6 Lat leg - Ankle 14  L Superficial peroneal - Ankle     Lat leg Ankle 4.1 ?4.4 2 ?6 Lat leg - Ankle 14             F  Wave    Nerve F Lat Ref.   ms ms  R Tibial - AH 54.7 ?56.0  L Tibial - AH 61.2 ?56.0

## 2020-07-20 NOTE — Progress Notes (Signed)
Please refer to EMG and nerve conduction procedure note.  

## 2020-07-20 NOTE — Procedures (Signed)
     HISTORY:  Josh Nicolosi is a 54 year old gentleman with a 41-month history of some tingling in the tips of the toes bilaterally that is more noticeable in the evening hours, sometimes associated with a burning sensation.  He denies any significant back pain or pain down the legs on either side.  He reports that the numbness is a bit more pronounced in the right foot as compared to the left.   NERVE CONDUCTION STUDIES:  Nerve conduction studies were performed on both upper extremities.  The distal motor latencies and motor branches for the peroneal and posterior tibial nerves were within normal limits bilaterally with slowing seen for the left peroneal and posterior tibial nerves, and borderline normal nerve conduction velocities were seen for the right peroneal and posterior tibial nerves.  The sensory latencies for the sural and peroneal nerves were normal bilaterally.  The F-wave latencies for the posterior tibial nerves were slightly prolonged on the left and normal on the right.  EMG STUDIES:  EMG study was performed on the right lower extremity:  The tibialis anterior muscle reveals 2 to 4K motor units with full recruitment. No fibrillations or positive waves were seen. The peroneus tertius muscle reveals 2 to 4K motor units with full recruitment. No fibrillations or positive waves were seen. The medial gastrocnemius muscle reveals 1 to 3K motor units with full recruitment. No fibrillations or positive waves were seen. The vastus lateralis muscle reveals 2 to 4K motor units with full recruitment. No fibrillations or positive waves were seen. The iliopsoas muscle reveals 2 to 4K motor units with full recruitment. No fibrillations or positive waves were seen. The biceps femoris muscle (long head) reveals 2 to 4K motor units with full recruitment. No fibrillations or positive waves were seen. The lumbosacral paraspinal muscles were tested at 3 levels, and revealed no abnormalities of  insertional activity at all 3 levels tested. There was good relaxation.  Impression:  Nerve conduction studies done on both lower extremities shows evidence of mild motor slowing, given the history may be consistent with an early peripheral neuropathy.  EMG evaluation of the right lower extremity was unremarkable, without evidence of an overlying lumbosacral radiculopathy.  York Spaniel

## 2020-07-21 ENCOUNTER — Telehealth: Payer: Self-pay

## 2020-07-21 NOTE — Telephone Encounter (Signed)
I called pt. No answer, left a message asking pt to call me back.   

## 2020-07-21 NOTE — Telephone Encounter (Signed)
-----   Message from [name], MD sent at 07/20/2020  5:01 PM EDT ----- Please call patient regarding his EMG and nerve conduction velocity test from today.  There is evidence of mild neuropathy.  No significant evidence of any moderate or severe changes, something we can certainly monitor.  No evidence of pinched nerve type findings from the back either.  Again, on the laboratory testing there was no obvious cause for neuropathy other than mild changes that prompted [place] to seek consultation with rheumatology.  For now, we can continue to monitor his exam.  We can have him follow-up in clinic in the next 3 to 6 months, please offer an appointment so we can track his symptoms and his examination.

## 2020-07-22 NOTE — Telephone Encounter (Signed)
Pt returned call and LVM. Please call back when available.  

## 2020-07-25 NOTE — Telephone Encounter (Signed)
I called the patient and provided him with the results below. He verbalized understanding of the findings. He will keep his pending follow up for further monitoring.

## 2020-10-03 ENCOUNTER — Ambulatory Visit: Payer: BC Managed Care – PPO | Admitting: Neurology

## 2020-10-03 ENCOUNTER — Encounter: Payer: Self-pay | Admitting: Neurology

## 2020-10-03 VITALS — BP 128/89 | HR 73 | Ht 74.0 in | Wt 254.0 lb

## 2020-10-03 DIAGNOSIS — R202 Paresthesia of skin: Secondary | ICD-10-CM | POA: Diagnosis not present

## 2020-10-03 DIAGNOSIS — G629 Polyneuropathy, unspecified: Secondary | ICD-10-CM

## 2020-10-03 DIAGNOSIS — R200 Anesthesia of skin: Secondary | ICD-10-CM

## 2020-10-03 DIAGNOSIS — R768 Other specified abnormal immunological findings in serum: Secondary | ICD-10-CM | POA: Diagnosis not present

## 2020-10-03 NOTE — Patient Instructions (Addendum)
It was good to see you again today.    As discussed, your recent electrical nerve and muscle testing showed evidence of mild neuropathy, findings are stable on examination and I am glad to hear that you feel stable or slightly improved with regards to the painful sensation you are having in the feet.  Again, if need be, we can consider a medication called gabapentin for symptomatic control of your discomfort in the feet.    Your blood work was benign with the exception of a positive rheumatological marker called ANA.  I had suggested referral to rheumatology but this appointment has not been scheduled yet.  Please call [facility] rheumatology in case they were trying to reach out to you.  [facility]'s telephone number is [phone].  For now, I think you are okay to follow-up in this clinic on an as-needed basis.  Please follow-up with your primary care physician as scheduled.

## 2020-10-03 NOTE — Progress Notes (Signed)
Subjective:    Patient ID: Dennis [name] is a 54 y.o. male.  HPI     Interim history:   Dennis [name] is a 54 year old gentleman with an underlying medical history of PSVT, kidney stones, asthma, and mild obesity, who Presents for follow-up consultation of his numbness and tingling affecting his lower extremities.  The patient is unaccompanied today.  I first met him on 07/04/2020 at the request of his primary care physician, at which time he reported a 6+ month history of numbness and tingling affecting his feet, particularly his big toe area and balls of his feet, right more than left.  Examination showed mild decrease in pinprick and vibration sense in the distal lower extremities bilaterally and otherwise was benign.  I suggested we proceed with additional testing in the form of blood work and EMG as well as nerve conduction velocity testing through our office.  Blood work was benign with the exception of positive ANA.  He also had a positive ENA RNP antibody.  I suggested a referral to rheumatology for input.  He had an EMG and nerve conduction velocity test on 07/20/2020 and I reviewed the results: Impression:  Nerve conduction studies done on both lower extremities shows evidence of mild motor slowing, given the history may be consistent with an early peripheral neuropathy.  EMG evaluation of the right lower extremity was unremarkable, without evidence of an overlying lumbosacral radiculopathy. We called him with his test results.  Today, 10/03/20: He reports that his symptoms are more or less stable, in fact, the more painful sensation such as burning in his toes and feet particularly on the right side is a little better.  He admits that the EMG was painful.  After he had the EMG in this office he went to see his chiropractor, typically goes once or twice a year only but went back 3 times and felt that adjusting the lower back had helped his symptoms.  He is not in significant amount of  discomfort, has not noticed any new symptoms such as weakness or worsening, ascending symptoms or upper extremity symptoms.  He has not had a consultation with rheumatology but is open to seeking consultation as previously recommended.  He is not sure if he missed a call from rheumatology to schedule an appointment and would be willing to call them.  The patient's allergies, current medications, family history, past medical history, past social history, past surgical history and problem list were reviewed and updated as appropriate.   Previously:   07/04/20: (He) reports numbness and tingling affecting his big toe area and balls of his feet, right side more than left for the past 6+ months.  Symptoms were intermittent but seem to be more frequent, more pronounced in the right foot, with discomfort reported.  I reviewed your office records.  He had a recent right foot x-ray with benign findings per patient report.  He had some blood work through your office at the time in July with mildly elevated triglycerides and LDL per patient report.  He is not diabetic.  He has never been labeled as prediabetic.  No family history of neuropathy.  No history of back pain or radiating pain, no injuries.  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  No obvious exposure to any toxins, does not have a history of heavy alcohol use.  He drinks alcohol in the form of beer, about 3-4 beers per week on average, tries to hydrate well with water.  Does not drink daily caffeine. Symptoms appear to be more noticeable at night, sometimes he has discomfort before falling asleep but it does not necessarily prevent him from falling asleep or waking up in the middle of the night.  He tries to stay active.  He has not had any recent falls.  He has not had any weakness.  No symptoms in the upper extremities.  No facial symptoms.  His Past Medical History Is Significant For: Past Medical History:  Diagnosis Date  . Asthma   . History of  kidney stones   . Renal disorder    kidney stones    His Past Surgical History Is Significant For: Past Surgical History:  Procedure Laterality Date  . EXTRACORPOREAL SHOCK WAVE LITHOTRIPSY Left 03/28/2017   Procedure: LEFT EXTRACORPOREAL SHOCK WAVE LITHOTRIPSY (ESWL);  Surgeon: Franchot Gallo, MD;  Location: [facility];  Service: Urology;  Laterality: Left;  . LITHOTRIPSY    . STENT PLACE LEFT URETER ([place] HX) Bilateral     His Family History Is Significant For: Family History  Problem Relation Age of Onset  . Heart disease Father   . Lung cancer Paternal Grandfather     His Social History Is Significant For: Social History   Socioeconomic History  . Marital status: Married    Spouse name: Not on file  . Number of children: Not on file  . Years of education: Not on file  . Highest education level: Not on file  Occupational History  . Not on file  Tobacco Use  . Smoking status: Never Smoker  . Smokeless tobacco: Never Used  Vaping Use  . Vaping Use: Never used  Substance and Sexual Activity  . Alcohol use: No    Comment: socially  . Drug use: No  . Sexual activity: Yes  Other Topics Concern  . Not on file  Social History Narrative  . Not on file   Social Determinants of Health   Financial Resource Strain: Not on file  Food Insecurity: Not on file  Transportation Needs: Not on file  Physical Activity: Not on file  Stress: Not on file  Social Connections: Not on file    His Allergies Are:  No Known Allergies:   His Current Medications Are:  Outpatient Encounter Medications as of 10/03/2020  Medication Sig  . metoprolol succinate (TOPROL-XL) 25 MG 24 hr tablet TAKE 1 TABLET (25 MG TOTAL) BY MOUTH DAILY. NO FURTHER REFILLS UNTIL SEEN BY MD  . PROAIR HFA 108 (90 Base) MCG/ACT inhaler INHALE 2 PUFFS BY MOUTH DAILY AS NEEDED   No facility-administered encounter medications on file as of 10/03/2020.  :  Review of Systems:  Out of a complete 14 point review  of systems, all are reviewed and negative with the exception of these symptoms as listed below:  Review of Systems  Neurological:       Pt presents today to follow up on the burning sensation in his toes. His symptoms have not worsened but are still bothersome.    Objective:  Neurological Exam  Physical Exam Physical Examination:   Vitals:   10/03/20 1334  BP: 128/89  Pulse: 73    General Examination: The patient is a very pleasant 54 y.o. male in no acute distress. He appears well-developed and well-nourished and well groomed.   HEENT: Normocephalic, atraumatic, pupils are equal, round and reactive to light, extraocular tracking is good. Hearing is grossly intact. Face is symmetric with normal facial animation and normal facial sensation. Speech is  clear with no dysarthria noted. There is no hypophonia. There is no lip, neck/head, jaw or voice tremor. Neck is supple with full range of passive and active motion. There are no carotid bruits on auscultation. Oropharynx exam reveals: mild mouth dryness, adequate dental hygiene. Tongue protrudes centrally and palate elevates symmetrically.   Chest: Clear to auscultation without wheezing, rhonchi or crackles noted.  Heart: S1+S2+0, regular and normal without murmurs, rubs or gallops noted.   Abdomen: Soft, non-tender and non-distended with normal bowel sounds appreciated on auscultation.  Extremities: There is no pitting edema in the distal lower extremities bilaterally. Pedal pulses are intact.  Skin: Warm and dry without trophic changes noted. No varicose veins.   Musculoskeletal: exam reveals no obvious joint deformities, tenderness or joint swelling or erythema.   Neurologically:  Mental status: The patient is awake, alert and oriented in all 4 spheres. His immediate and remote memory, attention, language skills and fund of knowledge are appropriate. There is no evidence of aphasia, agnosia, apraxia or anomia. Speech is  clear with normal prosody and enunciation. Thought process is linear. Mood is normal and affect is normal.  Cranial nerves II - XII are as described above under HEENT exam. In addition: shoulder shrug is normal with equal shoulder height noted. Motor exam: Normal bulk, strength and tone is noted.  No focal or generalized atrophy, no evidence of fasciculations in his calves or forearms.  There is no drift, tremor or rebound. Romberg is negative. Reflexes are 2+ throughout, including ankles. Babinski: Toes are flexor bilaterally. Fine motor skills and coordination: intact in the UEs and LEs. .  Cerebellar testing: No dysmetria or intention tremor on finger to nose testing. Heel to shin is unremarkable bilaterally. There is no truncal or gait ataxia.  Sensory exam: intact to light touch, pinprick, vibration, temperature sense in the UEs and LEs with the exception of decreased sensation to pinprick and vibration sense in the distal lower extremities bilaterally, affecting the big toe area primarily, right-sided is slightly worse than left. Gait, station and balance: He stands easily. No veering to one side is noted. No leaning to one side is noted. Posture is age-appropriate and stance is narrow based. Gait shows normal stride length and normal pace. No problems turning are noted. Tandem walk is unremarkable.   Assessment and Plan:  In summary, ENOCH MOFFA is a very pleasant 54 year old male with an underlying medical history of PSVT, kidney stones, asthma, and mild obesity, who presents for follow-up consultation of his neuropathy with symptoms of numbness and intermittent pain affecting both distal lower extremities, right more than left of approximately 9 months duration.  He feels fairly stable, in fact the painful sensation has improved a little bit.  He has had some chiropractor visits which he feels have been helpful.  EMG nerve conduction velocity testing in September 2021 showed mild evidence of  peripheral neuropathy, no significant radiculopathy noted.  Exam is stable.  Blood work showed mostly benign results, positive ANA was noted and I had recommended input from rheumatology.  He is advised that this could very well be a one-time consultation but may be worth looking into; he is agreeable.  We provided the phone number for [facility] rheumatology.  We talked about symptomatic treatment of paresthesias and painful sensations and he declines a prescription for gabapentin today, feels that he has reached a point of feeling no significant pain and is able to function at his normal level.   So far, a  specific cause for neuropathy has not been found.  At this juncture, we mutually agreed to have him follow in this clinic on an as-needed basis.  I answered all his questions today and he was in agreement with the plan. I spent 30 minutes in total face-to-face time and in reviewing records during pre-charting, more than 50% of which was spent in counseling and coordination of care, reviewing test results, reviewing medications and treatment regimen and/or in discussing or reviewing the diagnosis of PN, the prognosis and treatment options. Pertinent laboratory and imaging test results that were available during this visit with the patient were reviewed by me and considered in my medical decision making (see chart for details).

## 2020-12-20 DIAGNOSIS — L570 Actinic keratosis: Secondary | ICD-10-CM | POA: Diagnosis not present

## 2020-12-20 DIAGNOSIS — C44519 Basal cell carcinoma of skin of other part of trunk: Secondary | ICD-10-CM | POA: Diagnosis not present

## 2020-12-20 DIAGNOSIS — L821 Other seborrheic keratosis: Secondary | ICD-10-CM | POA: Diagnosis not present

## 2020-12-29 DIAGNOSIS — C44519 Basal cell carcinoma of skin of other part of trunk: Secondary | ICD-10-CM | POA: Diagnosis not present

## 2020-12-29 DIAGNOSIS — L988 Other specified disorders of the skin and subcutaneous tissue: Secondary | ICD-10-CM | POA: Diagnosis not present

## 2021-04-23 DIAGNOSIS — Z79899 Other long term (current) drug therapy: Secondary | ICD-10-CM | POA: Diagnosis not present

## 2021-04-23 DIAGNOSIS — Z8679 Personal history of other diseases of the circulatory system: Secondary | ICD-10-CM | POA: Diagnosis not present

## 2021-04-23 DIAGNOSIS — Z87442 Personal history of urinary calculi: Secondary | ICD-10-CM | POA: Diagnosis not present

## 2021-04-23 DIAGNOSIS — R000 Tachycardia, unspecified: Secondary | ICD-10-CM | POA: Diagnosis not present

## 2021-04-23 DIAGNOSIS — J45909 Unspecified asthma, uncomplicated: Secondary | ICD-10-CM | POA: Diagnosis not present

## 2021-04-23 DIAGNOSIS — Z7982 Long term (current) use of aspirin: Secondary | ICD-10-CM | POA: Diagnosis not present

## 2021-04-23 DIAGNOSIS — F17210 Nicotine dependence, cigarettes, uncomplicated: Secondary | ICD-10-CM | POA: Diagnosis not present

## 2021-04-23 DIAGNOSIS — Z6832 Body mass index (BMI) 32.0-32.9, adult: Secondary | ICD-10-CM | POA: Diagnosis not present

## 2021-04-23 DIAGNOSIS — E669 Obesity, unspecified: Secondary | ICD-10-CM | POA: Diagnosis not present

## 2021-04-23 DIAGNOSIS — I4891 Unspecified atrial fibrillation: Secondary | ICD-10-CM | POA: Diagnosis not present

## 2021-04-23 DIAGNOSIS — E860 Dehydration: Secondary | ICD-10-CM | POA: Diagnosis not present

## 2021-04-24 DIAGNOSIS — I4891 Unspecified atrial fibrillation: Secondary | ICD-10-CM | POA: Diagnosis not present

## 2021-04-25 ENCOUNTER — Telehealth: Payer: Self-pay | Admitting: Cardiology

## 2021-04-25 NOTE — Telephone Encounter (Signed)
Patient would like to switch from Dr. Dulce Sellar to Dr. Mauri Brooklyn. Please let the patient know what the office decides

## 2021-04-27 ENCOUNTER — Encounter: Payer: Self-pay | Admitting: Cardiology

## 2021-04-27 ENCOUNTER — Ambulatory Visit: Payer: BC Managed Care – PPO | Admitting: Cardiology

## 2021-04-27 ENCOUNTER — Other Ambulatory Visit: Payer: Self-pay

## 2021-04-27 VITALS — BP 90/58 | HR 47 | Ht 74.0 in | Wt 252.6 lb

## 2021-04-27 DIAGNOSIS — I471 Supraventricular tachycardia, unspecified: Secondary | ICD-10-CM

## 2021-04-27 DIAGNOSIS — I480 Paroxysmal atrial fibrillation: Secondary | ICD-10-CM

## 2021-04-27 DIAGNOSIS — R5383 Other fatigue: Secondary | ICD-10-CM

## 2021-04-27 DIAGNOSIS — G4719 Other hypersomnia: Secondary | ICD-10-CM | POA: Diagnosis not present

## 2021-04-27 DIAGNOSIS — R079 Chest pain, unspecified: Secondary | ICD-10-CM

## 2021-04-27 HISTORY — DX: Paroxysmal atrial fibrillation: I48.0

## 2021-04-27 MED ORDER — APIXABAN 5 MG PO TABS: 5.0000 mg | ORAL_TABLET | Freq: Two times a day (BID) | ORAL | 1 refills | Status: DC

## 2021-04-27 NOTE — Progress Notes (Signed)
Cardiology Office Note:    Date:  04/27/2021   ID:  Dennis [name], DOB 1966-01-11, MRN [id number]  PCP:  Noni Saupe, MD  Cardiologist:  Gypsy Balsam, MD    Referring MD: Noni Saupe, MD   Chief Complaint  Patient presents with   Atrial Fibrillation    History of Present Illness:    Dennis [name] is a 55 y.o. male with past medical history significant for supraventricular tachycardia, PVCs successfully suppressed with beta-blocker.  Recently he ended up going to the emergency room because of palpitations he was find to be in atrial fibrillation.  He was put on higher dose of metoprolol as well as he was given diltiazem with instructions to take it on as-needed basis for tachycardia.  Echocardiogram has been done showed preserved left ventricular ejection fraction with only mildly enlarged left atrium.  He comes today to talk about this issue.  Overall he is feeling fine still described to have episodes when his heart will speed up some.  He described 1 situation and he worked in the garden and saw a black snake his heart start spitting up he waited about 20 minutes it was still going on eventually end up taking 30 mg Cardizem which slowed down his heart quite nicely.  He has not been anticoagulated.  Denies have any chest pain tightness squeezing pressure mid chest no dizziness no passing out  Past Medical History:  Diagnosis Date   Asthma    History of kidney stones    Renal disorder    kidney stones    Past Surgical History:  Procedure Laterality Date   EXTRACORPOREAL SHOCK WAVE LITHOTRIPSY Left 03/28/2017   Procedure: LEFT EXTRACORPOREAL SHOCK WAVE LITHOTRIPSY (ESWL);  Surgeon: Marcine Matar, MD;  Location: [facility];  Service: Urology;  Laterality: Left;   LITHOTRIPSY     STENT PLACE LEFT URETER ([facility] HX) Bilateral     Current Medications: Current Meds  Medication Sig   apixaban (ELIQUIS) 5 MG TABS tablet Take 1 tablet (5 mg total) by mouth 2 (two)  times daily.   [employer] ASPIRIN ADULT LOW DOSE 81 MG chewable tablet Chew 81 mg by mouth daily.   diltiazem (CARDIZEM) 30 MG tablet Take 30 mg by mouth daily.   metoprolol succinate (TOPROL-XL) 50 MG 24 hr tablet Take 50 mg by mouth daily. Take with or immediately following a meal.   PROAIR HFA 108 (90 Base) MCG/ACT inhaler Inhale 2 puffs into the lungs every 6 (six) hours as needed for wheezing or shortness of breath.     Allergies:   Patient has no known allergies.   Social History   Socioeconomic History   Marital status: Married    Spouse name: Not on file   Number of children: Not on file   Years of education: Not on file   Highest education level: Not on file  Occupational History   Not on file  Tobacco Use   Smoking status: Never   Smokeless tobacco: Never  Vaping Use   Vaping Use: Never used  Substance and Sexual Activity   Alcohol use: No    Comment: socially   Drug use: No   Sexual activity: Yes  Other Topics Concern   Not on file  Social History Narrative   Not on file   Social Determinants of Health   Financial Resource Strain: Not on file  Food Insecurity: Not on file  Transportation Needs: Not on file  Physical Activity: Not  on file  Stress: Not on file  Social Connections: Not on file     Family History: The patient's family history includes Heart disease in his father; Lung cancer in his paternal grandfather. ROS:   Please see the history of present illness.    All 14 point review of systems negative except as described per history of present illness  EKGs/Labs/Other Studies Reviewed:      Recent Labs: 07/04/2020: ALT 23; BUN 12; Creatinine, Ser 1.11; Potassium 4.4; Sodium 141; TSH 0.994  Recent Lipid Panel No results found for: CHOL, TRIG, HDL, CHOLHDL, VLDL, LDLCALC, LDLDIRECT  Physical Exam:    VS:  BP (!) 90/58 (BP Location: Left Arm, Patient Position: Sitting)   Pulse (!) 47   Ht 6\' 2"  (1.88 m)   Wt 252 lb 9.6 oz (114.6 kg)   SpO2 97%    BMI 32.43 kg/m     Wt Readings from Last 3 Encounters:  04/27/21 252 lb 9.6 oz (114.6 kg)  10/03/20 254 lb (115.2 kg)  07/04/20 251 lb 5 oz (114 kg)     GEN:  Well nourished, well developed in no acute distress HEENT: Normal NECK: No JVD; No carotid bruits LYMPHATICS: No lymphadenopathy CARDIAC: Irregularly irregular, no murmurs, no rubs, no gallops RESPIRATORY:  Clear to auscultation without rales, wheezing or rhonchi  ABDOMEN: Soft, non-tender, non-distended MUSCULOSKELETAL:  No edema; No deformity  SKIN: Warm and dry LOWER EXTREMITIES: no swelling NEUROLOGIC:  Alert and oriented x 3 PSYCHIATRIC:  Normal affect   ASSESSMENT:    1. Excessive daytime sleepiness   2. SVT (supraventricular tachycardia) (HCC)   3. Chest pain in adult   4. Fatigue, unspecified type   5. Paroxysmal atrial fibrillation (HCC)    PLAN:    In order of problems listed above:  Atrial fibrillation which is persistent right now.  He has been more than 48 hours in it.  I will ask him to start taking Eliquis 5 mg twice daily, we will continue with this approach for the 4 weeks and then we will make arrangements for cardioversion if he did not convert himself.  He is taking over the AV blocking agent in form of metoprolol as well as Cardizem on as-needed basis however concern is his blood pressure being low.  Therefore, in spite of the fact his heart rate will be excessive today I will not be able to increase dose of those medications.  We also discussed alternative to waiting for 4 weeks with transesophageal echocardiogram and then if there is no LV thrombus cardioversion to sinus rhythm.  Also explained to him that his risk for having stroke is relatively low actually his CHADS2 Vascor equals 0.  However I prefer him to be anticoagulated for 4 weeks before attempting to cardiovert.  After that we will keep him on anticoagulation for another 3 weeks.  In terms of etiology of this phenomenon.  He does snore he is  with his wife in my office and she confirms that.  We will schedule him to have a sleep study.  He also drinks some alcohol and I told him to stop I explained to him that there is no safe level of alcohol for atrial fibrillation. I see him back in my office in 4 weeks if he still in atrial fibrillation we will make arrangements for cardioversion.  If he started having some difficulty taking anticoagulation then we will make arrangements for TEE and cardioversion. Hypotension obviously concerning he is completely asymptomatic we  will recheck his blood pressure before he left the room blood pressure was 90/60.  Ask him to drink plenty of fluids. I did review record from the hospital for this visit   Medication Adjustments/Labs and Tests Ordered: Current medicines are reviewed at length with the patient today.  Concerns regarding medicines are outlined above.  Orders Placed This Encounter  Procedures   EKG 12-Lead   Split night study   Medication changes:  Meds ordered this encounter  Medications   apixaban (ELIQUIS) 5 MG TABS tablet    Sig: Take 1 tablet (5 mg total) by mouth 2 (two) times daily.    Dispense:  60 tablet    Refill:  1    Signed, Georgeanna Lea, MD, [facility] 04/27/2021 4:32 PM    [facility]

## 2021-04-27 NOTE — Patient Instructions (Signed)
Medication Instructions:  Your physician has recommended you make the following change in your medication: Eliquis 5mg  twice daily *If you need a refill on your cardiac medications before your next appointment, please call your pharmacy*   Lab Work:  If you have labs (blood work) drawn today and your tests are completely normal, you will receive your results only by: MyChart Message (if you have MyChart) OR A paper copy in the mail If you have any lab test that is abnormal or we need to change your treatment, we will call you to review the results.   Testing/Procedures: Your physician has recommended that you have a sleep study. This test records several body functions during sleep, including: brain activity, eye movement, oxygen and carbon dioxide blood levels, heart rate and rhythm, breathing rate and rhythm, the flow of air through your mouth and nose, snoring, body muscle movements, and chest and belly movement.    Follow-Up: At [facility], you and your health needs are our priority.  As part of our continuing mission to provide you with exceptional heart care, we have created designated Provider Care Teams.  These Care Teams include your primary Cardiologist (physician) and Advanced Practice Providers (APPs -  Physician Assistants and Nurse Practitioners) who all work together to provide you with the care you need, when you need it.  We recommend signing up for the patient portal called "MyChart".  Sign up information is provided on this After Visit Summary.  MyChart is used to connect with patients for Virtual Visits (Telemedicine).  Patients are able to view lab/test results, encounter notes, upcoming appointments, etc.  Non-urgent messages can be sent to your provider as well.   To learn more about what you can do with MyChart, go to [facility].    Your next appointment:   4 week(s)  The format for your next appointment:   In Person  Provider:   [email or web address], MD   Other Instructions

## 2021-05-01 DIAGNOSIS — Z1331 Encounter for screening for depression: Secondary | ICD-10-CM | POA: Diagnosis not present

## 2021-05-01 DIAGNOSIS — Z6832 Body mass index (BMI) 32.0-32.9, adult: Secondary | ICD-10-CM | POA: Diagnosis not present

## 2021-05-01 DIAGNOSIS — I480 Paroxysmal atrial fibrillation: Secondary | ICD-10-CM | POA: Diagnosis not present

## 2021-05-05 ENCOUNTER — Telehealth: Payer: Self-pay | Admitting: *Deleted

## 2021-05-05 NOTE — Telephone Encounter (Signed)
-----   Message from Heywood Bene, CMA sent at 04/27/2021  4:20 PM EDT ----- Need pre cert for sleep study for the following patient. Thanks

## 2021-05-05 NOTE — Telephone Encounter (Signed)
Per Michele Mcalpine @12 :52 PM patient does not need a PA for sleep study. Plan does not participate with [facility]IM. Ok to schedule.

## 2021-05-08 NOTE — Telephone Encounter (Signed)
Spoke with the patient, is aware of upcoming appt for sleep study is on Sept 20th at 8pm. He's aware they will reach out with protocol information.

## 2021-05-24 DIAGNOSIS — Z87442 Personal history of urinary calculi: Secondary | ICD-10-CM | POA: Insufficient documentation

## 2021-05-24 DIAGNOSIS — J45909 Unspecified asthma, uncomplicated: Secondary | ICD-10-CM | POA: Insufficient documentation

## 2021-05-24 DIAGNOSIS — N289 Disorder of kidney and ureter, unspecified: Secondary | ICD-10-CM | POA: Insufficient documentation

## 2021-05-30 ENCOUNTER — Other Ambulatory Visit: Payer: Self-pay

## 2021-05-30 ENCOUNTER — Ambulatory Visit: Payer: BC Managed Care – PPO | Admitting: Cardiology

## 2021-05-30 ENCOUNTER — Encounter: Payer: Self-pay | Admitting: Cardiology

## 2021-05-30 VITALS — BP 102/82 | HR 94 | Ht 74.0 in | Wt 240.0 lb

## 2021-05-30 DIAGNOSIS — I480 Paroxysmal atrial fibrillation: Secondary | ICD-10-CM

## 2021-05-30 DIAGNOSIS — G4733 Obstructive sleep apnea (adult) (pediatric): Secondary | ICD-10-CM | POA: Insufficient documentation

## 2021-05-30 DIAGNOSIS — R002 Palpitations: Secondary | ICD-10-CM

## 2021-05-30 HISTORY — DX: Obstructive sleep apnea (adult) (pediatric): G47.33

## 2021-05-30 NOTE — Addendum Note (Signed)
Addended by: Heywood Bene on: 05/30/2021 10:56 AM   Modules accepted: Orders

## 2021-05-30 NOTE — H&P (View-Only) (Signed)
Cardiology Office Note:    Date:  05/30/2021   ID:  Dennis [name], DOB 1966-03-03, MRN [id number]  PCP:  Noni Saupe, MD  Cardiologist:  Gypsy Balsam, MD    Referring MD: Noni Saupe, MD   Chief Complaint  Patient presents with   Follow-up  I am still in atrial fibrillation  History of Present Illness:    Dennis [name] is a 55 y.o. male with past medical history significant for supraventricular tachycardia, PVCs that being successfully suppressed with beta-blocker.  Recently about a month ago he ended up coming to the emergency room because of palpitation he was found to be in atrial fibrillation he was put on higher dose of metoprolol as well as Cardizem on as-needed basis and anticoagulation has been initiated.  His CHADS2 Vascor is 0 however he is being anticoagulated for 4 weeks before elective cardioversion.  He denies have any chest pain tightness squeezing pressure burning chest palpitation still there he does check his EKG quite frequently on the apple watch which reports persistence of atrial fibrillation. He did have echocardiogram done about 4 weeks ago which showed preserved left ventricle chamber ejection fraction, mildly dilated left atrium  Past Medical History:  Diagnosis Date   Asthma    History of kidney stones    Renal disorder    kidney stones    Past Surgical History:  Procedure Laterality Date   EXTRACORPOREAL SHOCK WAVE LITHOTRIPSY Left 03/28/2017   Procedure: LEFT EXTRACORPOREAL SHOCK WAVE LITHOTRIPSY (ESWL);  Surgeon: Marcine Matar, MD;  Location: [facility];  Service: Urology;  Laterality: Left;   LITHOTRIPSY     STENT PLACE LEFT URETER ([facility] HX) Bilateral     Current Medications: Current Meds  Medication Sig   apixaban (ELIQUIS) 5 MG TABS tablet Take 1 tablet (5 mg total) by mouth 2 (two) times daily.   [employer]S ASPIRIN ADULT LOW DOSE 81 MG chewable tablet Chew 81 mg by mouth daily.   diltiazem (CARDIZEM) 30 MG tablet Take 30 mg by  mouth daily.   metoprolol succinate (TOPROL-XL) 50 MG 24 hr tablet Take 50 mg by mouth daily. Take with or immediately following a meal.   PROAIR HFA 108 (90 Base) MCG/ACT inhaler Inhale 2 puffs into the lungs every 6 (six) hours as needed for wheezing or shortness of breath.     Allergies:   Patient has no known allergies.   Social History   Socioeconomic History   Marital status: Married    Spouse name: Not on file   Number of children: Not on file   Years of education: Not on file   Highest education level: Not on file  Occupational History   Not on file  Tobacco Use   Smoking status: Never   Smokeless tobacco: Never  Vaping Use   Vaping Use: Never used  Substance and Sexual Activity   Alcohol use: No    Comment: socially   Drug use: No   Sexual activity: Yes  Other Topics Concern   Not on file  Social History Narrative   Not on file   Social Determinants of Health   Financial Resource Strain: Not on file  Food Insecurity: Not on file  Transportation Needs: Not on file  Physical Activity: Not on file  Stress: Not on file  Social Connections: Not on file     Family History: The patient's family history includes Heart disease in his father; Lung cancer in his paternal grandfather. ROS:  Please see the history of present illness.    All 14 point review of systems negative except as described per history of present illness  EKGs/Labs/Other Studies Reviewed:      Recent Labs: 07/04/2020: ALT 23; BUN 12; Creatinine, Ser 1.11; Potassium 4.4; Sodium 141; TSH 0.994  Recent Lipid Panel No results found for: CHOL, TRIG, HDL, CHOLHDL, VLDL, LDLCALC, LDLDIRECT  Physical Exam:    VS:  BP 102/82 (BP Location: Right Arm, Patient Position: Sitting)   Pulse 94   Ht 6\' 2"  (1.88 m)   Wt 240 lb (108.9 kg)   SpO2 95%   BMI 30.81 kg/m     Wt Readings from Last 3 Encounters:  05/30/21 240 lb (108.9 kg)  04/27/21 252 lb 9.6 oz (114.6 kg)  10/03/20 254 lb (115.2 kg)      GEN:  Well nourished, well developed in no acute distress HEENT: Normal NECK: No JVD; No carotid bruits LYMPHATICS: No lymphadenopathy CARDIAC: Irregularly irregular, no murmurs, no rubs, no gallops RESPIRATORY:  Clear to auscultation without rales, wheezing or rhonchi  ABDOMEN: Soft, non-tender, non-distended MUSCULOSKELETAL:  No edema; No deformity  SKIN: Warm and dry LOWER EXTREMITIES: no swelling NEUROLOGIC:  Alert and oriented x 3 PSYCHIATRIC:  Normal affect   ASSESSMENT:    1. Paroxysmal atrial fibrillation (HCC)   2. Obstructive sleep apnea   3. Palpitations    PLAN:    In order of problems listed above:  Persistent atrial fibrillation he is 4 weeks of anticoagulation I described procedure to him of electrical cardioversion his wife was present during the conversation.  He agreed to proceed with cardioversion.  All risk benefits as well as alternatives were described.  After that he need to continue for about 3 weeks with anticoagulation and then anticoagulation can be stopped. Obstructive sleep apnea: He is awaiting sleep study. Palpitations related to atrial fibrillation   Medication Adjustments/Labs and Tests Ordered: Current medicines are reviewed at length with the patient today.  Concerns regarding medicines are outlined above.  No orders of the defined types were placed in this encounter.  Medication changes: No orders of the defined types were placed in this encounter.   Signed, 10/05/20, MD, [facility] 05/30/2021 10:43 AM    [facility]

## 2021-05-30 NOTE — Addendum Note (Signed)
Addended by: Reynolds Bowl on: 05/30/2021 11:52 AM   Modules accepted: Orders

## 2021-05-30 NOTE — Patient Instructions (Signed)
Medication Instructions:  Your physician recommends that you continue on your current medications as directed. Please refer to the Current Medication list given to you today.  *If you need a refill on your cardiac medications before your next appointment, please call your pharmacy*   Lab Work: Your physician recommends that you return for lab work in:  TODAY: BMET, CBC  If you have labs (blood work) drawn today and your tests are completely normal, you will receive your results only by: MyChart Message (if you have MyChart) OR A paper copy in the mail If you have any lab test that is abnormal or we need to change your treatment, we will call you to review the results.   Testing/Procedures: Your physician has recommended that you have a Cardioversion (DCCV). Electrical Cardioversion uses a jolt of electricity to your heart either through paddles or wired patches attached to your chest. This is a controlled, usually prescheduled, procedure. Defibrillation is done under light anesthesia in the hospital, and you usually go home the day of the procedure. This is done to get your heart back into a normal rhythm. You are not awake for the procedure. Please see the instruction sheet given to you today.    Follow-Up: At [facility], you and your health needs are our priority.  As part of our continuing mission to provide you with exceptional heart care, we have created designated Provider Care Teams.  These Care Teams include your primary Cardiologist (physician) and Advanced Practice Providers (APPs -  Physician Assistants and Nurse Practitioners) who all work together to provide you with the care you need, when you need it.  We recommend signing up for the patient portal called "MyChart".  Sign up information is provided on this After Visit Summary.  MyChart is used to connect with patients for Virtual Visits (Telemedicine).  Patients are able to view lab/test results, encounter notes, upcoming  appointments, etc.  Non-urgent messages can be sent to your provider as well.   To learn more about what you can do with MyChart, go to [email or web address].    Your next appointment:   2 month(s)  The format for your next appointment:   In Person  Provider:   Gypsy Balsam, MD   Other Instructions        [facility]" [address]    DATE AND TIME OF PROCEDURE:___8/09/2021 9am_ PROVIDER: __Dr. Christopher__  DO NOT EAT OR DRINK ANYTHING after midnight prior to your procedure.  You should take your medications as usual with a sip of water.  If you are diabetic, DO NOT TAKE YOUR DIABETIC MEDICATIONS the morning OF THE PROCEDURE.  If you are taking Lanoxin (also called Digoxin), do NOT take this medication the morning of the procedure.  DO NOT STOP any blood thinners that you may be taking.  Have your blood drawn as intructed.  You will need someone with you to drive you home after the procedure.  If you have any questions, please call our office at [phone].

## 2021-05-30 NOTE — Progress Notes (Signed)
Cardiology Office Note:    Date:  05/30/2021   ID:  Dennis [name], DOB 1966-03-03, MRN [id number]  PCP:  Dennis Saupe, MD  Cardiologist:  Dennis Balsam, MD    Referring MD: Dennis Saupe, MD   Chief Complaint  Patient presents with   Follow-up  I am still in atrial fibrillation  History of Present Illness:    Dennis [name] is a 55 y.o. male with past medical history significant for supraventricular tachycardia, PVCs that being successfully suppressed with beta-blocker.  Recently about a month ago he ended up coming to the emergency room because of palpitation he was found to be in atrial fibrillation he was put on higher dose of metoprolol as well as Cardizem on as-needed basis and anticoagulation has been initiated.  His CHADS2 Vascor is 0 however he is being anticoagulated for 4 weeks before elective cardioversion.  He denies have any chest pain tightness squeezing pressure burning chest palpitation still there he does check his EKG quite frequently on the apple watch which reports persistence of atrial fibrillation. He did have echocardiogram done about 4 weeks ago which showed preserved left ventricle chamber ejection fraction, mildly dilated left atrium  Past Medical History:  Diagnosis Date   Asthma    History of kidney stones    Renal disorder    kidney stones    Past Surgical History:  Procedure Laterality Date   EXTRACORPOREAL SHOCK WAVE LITHOTRIPSY Left 03/28/2017   Procedure: LEFT EXTRACORPOREAL SHOCK WAVE LITHOTRIPSY (ESWL);  Surgeon: Marcine Matar, MD;  Location: [facility];  Service: Urology;  Laterality: Left;   LITHOTRIPSY     STENT PLACE LEFT URETER ([facility] HX) Bilateral     Current Medications: Current Meds  Medication Sig   apixaban (ELIQUIS) 5 MG TABS tablet Take 1 tablet (5 mg total) by mouth 2 (two) times daily.   [employer]S ASPIRIN ADULT LOW DOSE 81 MG chewable tablet Chew 81 mg by mouth daily.   diltiazem (CARDIZEM) 30 MG tablet Take 30 mg by  mouth daily.   metoprolol succinate (TOPROL-XL) 50 MG 24 hr tablet Take 50 mg by mouth daily. Take with or immediately following a meal.   PROAIR HFA 108 (90 Base) MCG/ACT inhaler Inhale 2 puffs into the lungs every 6 (six) hours as needed for wheezing or shortness of breath.     Allergies:   Patient has no known allergies.   Social History   Socioeconomic History   Marital status: Married    Spouse name: Not on file   Number of children: Not on file   Years of education: Not on file   Highest education level: Not on file  Occupational History   Not on file  Tobacco Use   Smoking status: Never   Smokeless tobacco: Never  Vaping Use   Vaping Use: Never used  Substance and Sexual Activity   Alcohol use: No    Comment: socially   Drug use: No   Sexual activity: Yes  Other Topics Concern   Not on file  Social History Narrative   Not on file   Social Determinants of Health   Financial Resource Strain: Not on file  Food Insecurity: Not on file  Transportation Needs: Not on file  Physical Activity: Not on file  Stress: Not on file  Social Connections: Not on file     Family History: The patient's family history includes Heart disease in his father; Lung cancer in his paternal grandfather. ROS:  Please see the history of present illness.    All 14 point review of systems negative except as described per history of present illness  EKGs/Labs/Other Studies Reviewed:      Recent Labs: 07/04/2020: ALT 23; BUN 12; Creatinine, Ser 1.11; Potassium 4.4; Sodium 141; TSH 0.994  Recent Lipid Panel No results found for: CHOL, TRIG, HDL, CHOLHDL, VLDL, LDLCALC, LDLDIRECT  Physical Exam:    VS:  BP 102/82 (BP Location: Right Arm, Patient Position: Sitting)   Pulse 94   Ht 6' 2" (1.88 m)   Wt 240 lb (108.9 kg)   SpO2 95%   BMI 30.81 kg/m     Wt Readings from Last 3 Encounters:  05/30/21 240 lb (108.9 kg)  04/27/21 252 lb 9.6 oz (114.6 kg)  10/03/20 254 lb (115.2 kg)      GEN:  Well nourished, well developed in no acute distress HEENT: Normal NECK: No JVD; No carotid bruits LYMPHATICS: No lymphadenopathy CARDIAC: Irregularly irregular, no murmurs, no rubs, no gallops RESPIRATORY:  Clear to auscultation without rales, wheezing or rhonchi  ABDOMEN: Soft, non-tender, non-distended MUSCULOSKELETAL:  No edema; No deformity  SKIN: Warm and dry LOWER EXTREMITIES: no swelling NEUROLOGIC:  Alert and oriented x 3 PSYCHIATRIC:  Normal affect   ASSESSMENT:    1. Paroxysmal atrial fibrillation (HCC)   2. Obstructive sleep apnea   3. Palpitations    PLAN:    In order of problems listed above:  Persistent atrial fibrillation he is 4 weeks of anticoagulation I described procedure to him of electrical cardioversion his wife was present during the conversation.  He agreed to proceed with cardioversion.  All risk benefits as well as alternatives were described.  After that he need to continue for about 3 weeks with anticoagulation and then anticoagulation can be stopped. Obstructive sleep apnea: He is awaiting sleep study. Palpitations related to atrial fibrillation   Medication Adjustments/Labs and Tests Ordered: Current medicines are reviewed at length with the patient today.  Concerns regarding medicines are outlined above.  No orders of the defined types were placed in this encounter.  Medication changes: No orders of the defined types were placed in this encounter.   Signed, Ryann Leavitt J. Rayden Scheper, MD, FACC 05/30/2021 10:43 AM     [facility]  

## 2021-05-31 LAB — CBC WITH DIFFERENTIAL/PLATELET
Basophils Absolute: 0.1 10*3/uL (ref 0.0–0.2)
Basos: 1 %
EOS (ABSOLUTE): 0.2 10*3/uL (ref 0.0–0.4)
Eos: 2 %
Hematocrit: 47 % (ref 37.5–51.0)
Hemoglobin: 16.1 g/dL (ref 13.0–17.7)
Immature Grans (Abs): 0 10*3/uL (ref 0.0–0.1)
Immature Granulocytes: 0 %
Lymphocytes Absolute: 2.4 10*3/uL (ref 0.7–3.1)
Lymphs: 32 %
MCH: 31.8 pg (ref 26.6–33.0)
MCHC: 34.3 g/dL (ref 31.5–35.7)
MCV: 93 fL (ref 79–97)
Monocytes Absolute: 0.8 10*3/uL (ref 0.1–0.9)
Monocytes: 11 %
Neutrophils Absolute: 4 10*3/uL (ref 1.4–7.0)
Neutrophils: 54 %
Platelets: 256 10*3/uL (ref 150–450)
RBC: 5.07 x10E6/uL (ref 4.14–5.80)
RDW: 12.3 % (ref 11.6–15.4)
WBC: 7.3 10*3/uL (ref 3.4–10.8)

## 2021-05-31 LAB — BASIC METABOLIC PANEL
BUN/Creatinine Ratio: 10 (ref 9–20)
BUN: 11 mg/dL (ref 6–24)
CO2: 25 mmol/L (ref 20–29)
Calcium: 9.3 mg/dL (ref 8.7–10.2)
Chloride: 106 mmol/L (ref 96–106)
Creatinine, Ser: 1.14 mg/dL (ref 0.76–1.27)
Glucose: 84 mg/dL (ref 65–99)
Potassium: 4.7 mmol/L (ref 3.5–5.2)
Sodium: 146 mmol/L — ABNORMAL HIGH (ref 134–144)
eGFR: 76 mL/min/{1.73_m2} (ref >59)

## 2021-06-02 ENCOUNTER — Ambulatory Visit (HOSPITAL_COMMUNITY): Admission: RE | Admit: 2021-06-02 | Payer: BC Managed Care – PPO | Source: Home / Self Care | Admitting: Cardiology

## 2021-06-02 ENCOUNTER — Encounter (HOSPITAL_COMMUNITY): Admission: RE | Payer: Self-pay | Source: Home / Self Care

## 2021-06-02 SURGERY — CARDIOVERSION
Anesthesia: General

## 2021-06-02 NOTE — Progress Notes (Signed)
Pt called morning of procedure and stated that he woke up not feeling well with a 101 fever. Pt stated he was cancelling for today. I told him to call his cardiology office and let them know and to reschedule. Pt agreed and stated he would call the office.

## 2021-06-04 ENCOUNTER — Telehealth: Payer: BC Managed Care – PPO | Admitting: Family

## 2021-06-04 ENCOUNTER — Encounter: Payer: Self-pay | Admitting: Family

## 2021-06-04 DIAGNOSIS — J029 Acute pharyngitis, unspecified: Secondary | ICD-10-CM | POA: Diagnosis not present

## 2021-06-04 DIAGNOSIS — U071 COVID-19: Secondary | ICD-10-CM | POA: Diagnosis not present

## 2021-06-04 MED ORDER — CETIRIZINE HCL 10 MG PO TABS: 10.0000 mg | ORAL_TABLET | Freq: Every day | ORAL | 11 refills | Status: DC

## 2021-06-04 MED ORDER — LIDOCAINE VISCOUS HCL 2 % MT SOLN: 15.0000 mL | OROMUCOSAL | 0 refills | Status: DC | PRN

## 2021-06-04 MED ORDER — FLUTICASONE PROPIONATE 50 MCG/ACT NA SUSP: 2.0000 | Freq: Every day | NASAL | 6 refills | Status: DC

## 2021-06-04 NOTE — Progress Notes (Signed)
Virtual Visit Consent   Dennis [name], you are scheduled for a virtual visit with a [facility] provider today.     Just as with appointments in the office, your consent must be obtained to participate.  Your consent will be active for this visit and any virtual visit you may have with one of our providers in the next 365 days.     If you have a MyChart account, a copy of this consent can be sent to you electronically.  All virtual visits are billed to your insurance company just like a traditional visit in the office.    As this is a virtual visit, video technology does not allow for your provider to perform a traditional examination.  This may limit your provider's ability to fully assess your condition.  If your provider identifies any concerns that need to be evaluated in person or the need to arrange testing (such as labs, EKG, etc.), we will make arrangements to do so.     Although advances in technology are sophisticated, we cannot ensure that it will always work on either your end or our end.  If the connection with a video visit is poor, the visit may have to be switched to a telephone visit.  With either a video or telephone visit, we are not always able to ensure that we have a secure connection.     I need to obtain your verbal consent now.   Are you willing to proceed with your visit today?    Dennis [name] has provided verbal consent on 06/04/2021 for a virtual visit (video or telephone).   Dennis Rodney, FNP   Date: 06/04/2021 8:45 AM   Virtual Visit via Video Note   I, Dennis [name], connected with  Dennis [name]  ([id number], 08-Sep-1966) on 06/04/21 at  8:45 AM EDT by a video-enabled telemedicine application and verified that I am speaking with the correct person using two identifiers.  Location: Patient: Virtual Visit Location Patient: Home Provider: Virtual Visit Location Provider: Home   I discussed the limitations of evaluation and management by telemedicine and  the availability of in person appointments. The patient expressed understanding and agreed to proceed.    History of Present Illness: Dennis [name] is a 55 y.o. who identifies as a male who was assigned male at birth, and is being seen today for COVID. His symptoms started Wednesday and tested positive on Friday.   HPI: Cough This is a new problem. The current episode started in the past 7 days. The problem has been gradually worsening. The problem occurs every few minutes. The cough is Productive of sputum. Associated symptoms include a fever, headaches, myalgias, nasal congestion, postnasal drip and a sore throat. Pertinent negatives include no ear congestion, ear pain, shortness of breath or wheezing. He has tried rest (motrin) for the symptoms. The treatment provided mild relief.   Problems:  Patient Active Problem List   Diagnosis Date Noted   Obstructive sleep apnea 05/30/2021   Asthma 05/24/2021   History of kidney stones 05/24/2021   Renal disorder 05/24/2021   Paroxysmal atrial fibrillation (HCC) 04/27/2021   SVT (supraventricular tachycardia) (HCC) 04/07/2019   Fatigue 04/07/2019   Palpitations 10/24/2017   SOB (shortness of breath) 10/24/2017   Chest pain in adult 10/24/2017    Allergies: No Known Allergies Medications:  Current Outpatient Medications:    cetirizine (ZYRTEC) 10 MG tablet, Take 1 tablet (10 mg total) by mouth daily., Disp: 30 tablet, Rfl:  11   fluticasone (FLONASE) 50 MCG/ACT nasal spray, Place 2 sprays into both nostrils daily., Disp: 16 g, Rfl: 6   lidocaine (XYLOCAINE) 2 % solution, Use as directed 15 mLs in the mouth or throat as needed for mouth pain., Disp: 100 mL, Rfl: 0   apixaban (ELIQUIS) 5 MG TABS tablet, Take 1 tablet (5 mg total) by mouth 2 (two) times daily., Disp: 60 tablet, Rfl: 1   diltiazem (CARDIZEM) 30 MG tablet, Take 30 mg by mouth every 6 (six) hours as needed (heart rate>100.)., Disp: , Rfl:    metoprolol succinate (TOPROL-XL) 50 MG 24  hr tablet, Take 50 mg by mouth in the morning. Take with or immediately following a meal., Disp: , Rfl:    PROAIR HFA 108 (90 Base) MCG/ACT inhaler, Inhale 2 puffs into the lungs every 6 (six) hours as needed for wheezing or shortness of breath., Disp: , Rfl: 5  Observations/Objective: Patient is well-developed, well-nourished in no acute distress.  Resting comfortably  at home.  Head is normocephalic, atraumatic.  No labored breathing.  Speech is clear and coherent with logical content.  Patient is alert and oriented at baseline.  Throat erythemas  Assessment and Plan: 1. COVID-19 virus detected - fluticasone (FLONASE) 50 MCG/ACT nasal spray; Place 2 sprays into both nostrils daily.  Dispense: 16 g; Refill: 6 - cetirizine (ZYRTEC) 10 MG tablet; Take 1 tablet (10 mg total) by mouth daily.  Dispense: 30 tablet; Refill: 11  2. Acute pharyngitis, unspecified etiology - fluticasone (FLONASE) 50 MCG/ACT nasal spray; Place 2 sprays into both nostrils daily.  Dispense: 16 g; Refill: 6 - cetirizine (ZYRTEC) 10 MG tablet; Take 1 tablet (10 mg total) by mouth daily.  Dispense: 30 tablet; Refill: 11 - lidocaine (XYLOCAINE) 2 % solution; Use as directed 15 mLs in the mouth or throat as needed for mouth pain.  Dispense: 100 mL; Refill: 0 - Take meds as prescribed - Use a cool mist humidifier  -Use saline nose sprays frequently -Force fluids -For any cough or congestion  Use plain Mucinex- regular strength or max strength is fine -For fever or aces or pains- take tylenol or ibuprofen. -Throat lozenges if help -New toothbrush in 3 days If throat pain continues, may need antibiotic   Follow Up Instructions: I discussed the assessment and treatment plan with the patient. The patient was provided an opportunity to ask questions and all were answered. The patient agreed with the plan and demonstrated an understanding of the instructions.  A copy of instructions were sent to the patient via  MyChart.  The patient was advised to call back or seek an in-person evaluation if the symptoms worsen or if the condition fails to improve as anticipated.  Time:  I spent 9 minutes with the patient via telehealth technology discussing the above problems/concerns.    Dennis Rodney, FNP

## 2021-06-19 ENCOUNTER — Ambulatory Visit (HOSPITAL_COMMUNITY)
Admission: RE | Admit: 2021-06-19 | Discharge: 2021-06-19 | Disposition: A | Discharge status: Home / Self Care | Payer: BC Managed Care – PPO | Attending: Internal Medicine | Admitting: Internal Medicine

## 2021-06-19 ENCOUNTER — Encounter (HOSPITAL_COMMUNITY)
Admission: RE | Disposition: A | Discharge status: Home / Self Care | Payer: Self-pay | Source: Home / Self Care | Attending: Internal Medicine

## 2021-06-19 ENCOUNTER — Ambulatory Visit (HOSPITAL_COMMUNITY): Payer: BC Managed Care – PPO | Admitting: Certified Registered Nurse Anesthetist

## 2021-06-19 ENCOUNTER — Other Ambulatory Visit: Payer: Self-pay

## 2021-06-19 ENCOUNTER — Encounter (HOSPITAL_COMMUNITY): Payer: Self-pay | Admitting: Internal Medicine

## 2021-06-19 DIAGNOSIS — G4733 Obstructive sleep apnea (adult) (pediatric): Secondary | ICD-10-CM | POA: Insufficient documentation

## 2021-06-19 DIAGNOSIS — Z7982 Long term (current) use of aspirin: Secondary | ICD-10-CM | POA: Diagnosis not present

## 2021-06-19 DIAGNOSIS — Z87442 Personal history of urinary calculi: Secondary | ICD-10-CM | POA: Diagnosis not present

## 2021-06-19 DIAGNOSIS — Z8249 Family history of ischemic heart disease and other diseases of the circulatory system: Secondary | ICD-10-CM | POA: Diagnosis not present

## 2021-06-19 DIAGNOSIS — I4819 Other persistent atrial fibrillation: Secondary | ICD-10-CM | POA: Insufficient documentation

## 2021-06-19 DIAGNOSIS — Z7901 Long term (current) use of anticoagulants: Secondary | ICD-10-CM | POA: Insufficient documentation

## 2021-06-19 DIAGNOSIS — Z79899 Other long term (current) drug therapy: Secondary | ICD-10-CM | POA: Diagnosis not present

## 2021-06-19 DIAGNOSIS — I471 Supraventricular tachycardia: Secondary | ICD-10-CM | POA: Diagnosis not present

## 2021-06-19 DIAGNOSIS — I480 Paroxysmal atrial fibrillation: Secondary | ICD-10-CM | POA: Diagnosis not present

## 2021-06-19 HISTORY — PX: CARDIOVERSION: SHX1299

## 2021-06-19 SURGERY — CARDIOVERSION
Anesthesia: General

## 2021-06-19 MED ORDER — SODIUM CHLORIDE 0.9 % IV SOLN: INTRAVENOUS | Status: DC

## 2021-06-19 MED ORDER — PROPOFOL 10 MG/ML IV BOLUS
INTRAVENOUS | Status: DC | PRN
  Administered 2021-06-19: 50 mg via INTRAVENOUS
  Administered 2021-06-19: 100 mg via INTRAVENOUS

## 2021-06-19 MED ORDER — LIDOCAINE 2% (20 MG/ML) 5 ML SYRINGE
INTRAMUSCULAR | Status: DC | PRN
  Administered 2021-06-19: 60 mg via INTRAVENOUS

## 2021-06-19 NOTE — Interval H&P Note (Signed)
History and Physical Interval Note:  06/19/2021 8:31 AM  Dennis [name]  has presented today for surgery, with the diagnosis of AFIB.  The various methods of treatment have been discussed with the patient and family. After consideration of risks, benefits and other options for treatment, the patient has consented to  Procedure(s): CARDIOVERSION (N/A) as a surgical intervention.  The patient's history has been reviewed, patient examined, no change in status, stable for surgery.  I have reviewed the patient's chart and labs.  Questions were answered to the patient's satisfaction.     [name]

## 2021-06-19 NOTE — CV Procedure (Signed)
   CARDIOVERSION NOTE  Procedure: Electrical Cardioversion Indications:  Atrial Fibrillation  Procedure Details:  Consent: Risks of procedure as well as the alternatives and risks of each were explained to the (patient/caregiver).  Consent for procedure obtained.  Time Out: Verified patient identification, verified procedure, site/side was marked, verified correct patient position, special equipment/implants available, medications/allergies/relevent history reviewed, required imaging and test results available.  Performed  Patient placed on cardiac monitor, pulse oximetry, supplemental oxygen as necessary.  Sedation given:  propofol per anesthesia Pacer pads placed anterior and posterior chest.  Cardioverted 3 time(s).  Cardioverted at 150J and 200J x 2 (second attempt with pressure).  Impression: Findings: Post procedure EKG shows: Atrial Fibrillation Complications: None Patient did tolerate procedure well.  Plan: Unsuccessful DCCV after 3 stacked shocks - no evidence for sinus beats. Apparently he may have been in afib since July - family history of this. Has reportedly only mild LAE.  Will likely need AAD therapy and repeat DCCV attempt or consider referral for catheter ablation given young age.  Time Spent Directly with the Patient:  30 minutes   [name], MD, [facility], FACP  [place]  [facility]  Medical Director of the [facility]  [facility] Diplomate of the American Board of Clinical Lipidology Attending Cardiologist  Direct Dial: [phone]  Fax: [phone]  Website:  [email or web address] Nicely Keren Alverio 06/19/2021, 9:05 AM

## 2021-06-19 NOTE — Discharge Instructions (Signed)

## 2021-06-19 NOTE — Anesthesia Procedure Notes (Signed)
Procedure Name: General with mask airway Date/Time: 06/19/2021 9:10 AM Performed by: Samara Deist, CRNA Pre-anesthesia Checklist: Patient identified, Emergency Drugs available, Suction available, Patient being monitored and Timeout performed Patient Re-evaluated:Patient Re-evaluated prior to induction Oxygen Delivery Method: Ambu bag Preoxygenation: Pre-oxygenation with 100% oxygen Induction Type: IV induction Ventilation: Mask ventilation without difficulty Placement Confirmation: positive ETCO2 Dental Injury: Teeth and Oropharynx as per pre-operative assessment

## 2021-06-19 NOTE — Anesthesia Postprocedure Evaluation (Signed)
Anesthesia Post Note  Patient: Dennis [name]  Procedure(s) Performed: CARDIOVERSION     Patient location during evaluation: PACU Anesthesia Type: General Level of consciousness: awake and alert Pain management: pain level controlled Vital Signs Assessment: post-procedure vital signs reviewed and stable Respiratory status: spontaneous breathing, nonlabored ventilation and respiratory function stable Cardiovascular status: stable, blood pressure returned to baseline and tachycardic Anesthetic complications: no   No notable events documented.  Last Vitals:  Vitals:   06/19/21 0920 06/19/21 0927  BP: 93/69 107/68  Pulse: (!) 104 (!) 150  Resp: 12 17  Temp:    SpO2: 93% 93%    Last Pain:  Vitals:   06/19/21 0920  TempSrc:   PainSc: 0-No pain                 Beryle Lathe

## 2021-06-19 NOTE — Transfer of Care (Signed)
Immediate Anesthesia Transfer of Care Note  Patient: Dennis [name]  Procedure(s) Performed: CARDIOVERSION  Patient Location: Endoscopy Unit  Anesthesia Type:General  Level of Consciousness: awake, alert  and oriented  Airway & Oxygen Therapy: Patient Spontanous Breathing  Post-op Assessment: Report given to RN and Post -op Vital signs reviewed and stable  Post vital signs: Reviewed and stable  Last Vitals:  Vitals Value Taken Time  BP    Temp    Pulse    Resp    SpO2      Last Pain:  Vitals:   06/19/21 0803  TempSrc: Temporal  PainSc: 0-No pain         Complications: No notable events documented.

## 2021-06-19 NOTE — Anesthesia Preprocedure Evaluation (Addendum)
Anesthesia Evaluation  Patient identified by MRN, date of birth, ID band Patient awake    Reviewed: Allergy & Precautions, NPO status , Patient's Chart, lab work & pertinent test results  History of Anesthesia Complications Negative for: history of anesthetic complications  Airway Mallampati: I  TM Distance: >3 FB Neck ROM: Full    Dental  (+) Dental Advisory Given, Teeth Intact   Pulmonary asthma , Current Smoker and Patient abstained from smoking.,   Being worked up for OSA, not yet tested    Pulmonary exam normal        Cardiovascular + dysrhythmias Atrial Fibrillation and Supra Ventricular Tachycardia  Rhythm:Irregular Rate:Tachycardia     Neuro/Psych negative neurological ROS  negative psych ROS   GI/Hepatic negative GI ROS, Neg liver ROS,   Endo/Other   Obesity   Renal/GU negative Renal ROS     Musculoskeletal negative musculoskeletal ROS (+)   Abdominal   Peds  Hematology  On eliquis    Anesthesia Other Findings   Reproductive/Obstetrics                            Anesthesia Physical Anesthesia Plan  ASA: 3  Anesthesia Plan: General   Post-op Pain Management:    Induction: Intravenous  PONV Risk Score and Plan: 2 and Treatment may vary due to age or medical condition and Propofol infusion  Airway Management Planned: Mask and Natural Airway  Additional Equipment: None  Intra-op Plan:   Post-operative Plan:   Informed Consent: I have reviewed the patients History and Physical, chart, labs and discussed the procedure including the risks, benefits and alternatives for the proposed anesthesia with the patient or authorized representative who has indicated his/her understanding and acceptance.       Plan Discussed with: CRNA and Anesthesiologist  Anesthesia Plan Comments:        Anesthesia Quick Evaluation

## 2021-06-20 ENCOUNTER — Encounter (HOSPITAL_COMMUNITY): Payer: Self-pay | Admitting: Internal Medicine

## 2021-06-20 ENCOUNTER — Ambulatory Visit: Payer: BC Managed Care – PPO | Admitting: Cardiology

## 2021-06-20 VITALS — BP 90/72 | HR 109 | Ht 74.0 in | Wt 235.2 lb

## 2021-06-20 DIAGNOSIS — R5383 Other fatigue: Secondary | ICD-10-CM | POA: Diagnosis not present

## 2021-06-20 DIAGNOSIS — I480 Paroxysmal atrial fibrillation: Secondary | ICD-10-CM | POA: Diagnosis not present

## 2021-06-20 DIAGNOSIS — I4819 Other persistent atrial fibrillation: Secondary | ICD-10-CM | POA: Diagnosis not present

## 2021-06-20 DIAGNOSIS — G4733 Obstructive sleep apnea (adult) (pediatric): Secondary | ICD-10-CM | POA: Diagnosis not present

## 2021-06-20 NOTE — Patient Instructions (Signed)

## 2021-06-20 NOTE — Progress Notes (Signed)
Cardiology Office Note:    Date:  06/20/2021   ID:  Dennis [name], DOB September 06, 1966, MRN [id number]  PCP:  Noni Saupe, MD  Cardiologist:  Gypsy Balsam, MD    Referring MD: Noni Saupe, MD   Chief Complaint  Patient presents with   Cardioversion follow 08/29    History of Present Illness:    Dennis [name] is a 55 y.o. male with past medical history significant for supraventricular tachycardia that was successfully suppressed with beta-blocker, recently he ended up going to the hospital because of the palpitations.  He was found to be in atrial fibrillation.  AV node was suppressed with beta-blocker as well as Cardizem and he was anticoagulated for 4 weeks with intention to cardiovert him.  He did have cardioversion just few weeks ago sadly in spite of the fact that he used 3 shocks with high-level energy we were unable to get him back to normal rhythm.  Not even for few beats.  He was in atrial fibrillation all the time. He denies have any chest pain tightness squeezing pressure burning chest, obviously he is very frustrated with the fact that he is atrial fibrillation cardioversion did not work.  Denies have any chest pain tightness squeezing pressure burning chest  Past Medical History:  Diagnosis Date   Asthma    History of kidney stones    Renal disorder    kidney stones    Past Surgical History:  Procedure Laterality Date   CARDIOVERSION N/A 06/19/2021   Procedure: CARDIOVERSION;  Surgeon: [name], MD;  [facility] ENDOSCOPY;  Service: Cardiovascular;  Laterality: N/A;   EXTRACORPOREAL SHOCK WAVE LITHOTRIPSY Left 03/28/2017   Procedure: LEFT EXTRACORPOREAL SHOCK WAVE LITHOTRIPSY (ESWL);  Surgeon: Marcine Matar, MD;  Location: [facility] ORS;  Service: Urology;  Laterality: Left;   LITHOTRIPSY     STENT PLACE LEFT URETER ([facility] HX) Bilateral     Current Medications: Current Meds  Medication Sig   apixaban (ELIQUIS) 5 MG TABS tablet Take 1 tablet  (5 mg total) by mouth 2 (two) times daily.   cetirizine (ZYRTEC) 10 MG tablet Take 1 tablet (10 mg total) by mouth daily.   diltiazem (CARDIZEM) 30 MG tablet Take 30 mg by mouth every 6 (six) hours as needed (heart rate>100.).   fluticasone (FLONASE) 50 MCG/ACT nasal spray Place 2 sprays into both nostrils daily.   metoprolol succinate (TOPROL-XL) 50 MG 24 hr tablet Take 50 mg by mouth in the morning. Take with or immediately following a meal.   PROAIR HFA 108 (90 Base) MCG/ACT inhaler Inhale 2 puffs into the lungs every 6 (six) hours as needed for wheezing or shortness of breath.     Allergies:   Patient has no known allergies.   Social History   Socioeconomic History   Marital status: Married    Spouse name: Not on file   Number of children: Not on file   Years of education: Not on file   Highest education level: Not on file  Occupational History   Not on file  Tobacco Use   Smoking status: Some Days    Types: Cigars   Smokeless tobacco: Never  Vaping Use   Vaping Use: Never used  Substance and Sexual Activity   Alcohol use: No    Comment: socially   Drug use: No   Sexual activity: Yes  Other Topics Concern   Not on file  Social History Narrative   Not on file  Social Determinants of Health   Financial Resource Strain: Not on file  Food Insecurity: Not on file  Transportation Needs: Not on file  Physical Activity: Not on file  Stress: Not on file  Social Connections: Not on file     Family History: The patient's family history includes Heart disease in his father; Lung cancer in his paternal grandfather. ROS:   Please see the history of present illness.    All 14 point review of systems negative except as described per history of present illness  EKGs/Labs/Other Studies Reviewed:      Recent Labs: 07/04/2020: ALT 23; TSH 0.994 05/30/2021: BUN 11; Creatinine, Ser 1.14; Hemoglobin 16.1; Platelets 256; Potassium 4.7; Sodium 146  Recent Lipid Panel No results  found for: CHOL, TRIG, HDL, CHOLHDL, VLDL, LDLCALC, LDLDIRECT  Physical Exam:    VS:  BP 90/72 (BP Location: Right Arm, Patient Position: Sitting)   Pulse (!) 109   Ht 6\' 2"  (1.88 m)   Wt 235 lb 3.2 oz (106.7 kg)   SpO2 95%   BMI 30.20 kg/m     Wt Readings from Last 3 Encounters:  06/20/21 235 lb 3.2 oz (106.7 kg)  06/19/21 237 lb (107.5 kg)  05/30/21 240 lb (108.9 kg)     GEN:  Well nourished, well developed in no acute distress HEENT: Normal NECK: No JVD; No carotid bruits LYMPHATICS: No lymphadenopathy CARDIAC: RRR, no murmurs, no rubs, no gallops RESPIRATORY:  Clear to auscultation without rales, wheezing or rhonchi  ABDOMEN: Soft, non-tender, non-distended MUSCULOSKELETAL:  No edema; No deformity  SKIN: Warm and dry LOWER EXTREMITIES: no swelling NEUROLOGIC:  Alert and oriented x 3 PSYCHIATRIC:  Normal affect   ASSESSMENT:    1. Paroxysmal atrial fibrillation (HCC)   2. Persistent atrial fibrillation (HCC)   3. Fatigue, unspecified type   4. Obstructive sleep apnea    PLAN:    In order of problems listed above:  Persistent atrial fibrillation.  Unsuccessful cardioversion.  We had a long discussion today about options and options are medical therapy first with antiarrhythmic with cardioversion after that versus considering atrial fibrillation ablation.  We had a long discussion with him as well as well as he with his wife.  He wants me to talk to EP team try to help with making decision.  He is leaning more towards atrial fibrillation ablation since cardioversion did not work so far. Fatigue still present probably related to atrial fibrillation Obstructive sleep apnea followed by antimedicine team.   Medication Adjustments/Labs and Tests Ordered: Current medicines are reviewed at length with the patient today.  Concerns regarding medicines are outlined above.  Orders Placed This Encounter  Procedures   EKG 12-Lead   Medication changes: No orders of the defined  types were placed in this encounter.   Signed, 07/30/21, MD, [facility] 06/20/2021 12:22 PM    [facility]

## 2021-06-27 ENCOUNTER — Telehealth: Payer: Self-pay

## 2021-06-27 NOTE — Telephone Encounter (Signed)
-----   Message from Gaynelle Cage, CMA sent at 06/22/2021  1:39 PM EDT ----- Looks like you scheduled t but a precert has not been done. ----- Message ----- From: Heywood Bene, CMA Sent: 06/21/2021   3:53 PM EDT To: [facility]  Good afternoon, has sleep study request been pre-cert and schedule? ----- Message ----- From: Heywood Bene, CMA Sent: 04/27/2021   4:21 PM EDT To: [facility]  Need pre cert for sleep study for the following patient. Thanks

## 2021-06-27 NOTE — Telephone Encounter (Signed)
S Study schedule on 09/20 at 8pm. Patient informed

## 2021-06-30 ENCOUNTER — Other Ambulatory Visit: Payer: Self-pay | Admitting: Cardiology

## 2021-06-30 NOTE — Telephone Encounter (Signed)
Pt last saw Dr Bing Matter 06/20/21, last labs 05/30/21 Creat 1.14, age 55, weight 106.7kg, based on specified criteria pt is on appropriate dosage of Eliquis 5mg  BID for afib.  Will refill rx.

## 2021-07-03 ENCOUNTER — Ambulatory Visit: Payer: BC Managed Care – PPO | Admitting: Cardiology

## 2021-07-03 ENCOUNTER — Other Ambulatory Visit: Payer: Self-pay

## 2021-07-03 ENCOUNTER — Encounter: Payer: Self-pay | Admitting: Cardiology

## 2021-07-03 VITALS — BP 106/80 | HR 91 | Ht 74.0 in | Wt 232.0 lb

## 2021-07-03 DIAGNOSIS — I4819 Other persistent atrial fibrillation: Secondary | ICD-10-CM

## 2021-07-03 DIAGNOSIS — Z79899 Other long term (current) drug therapy: Secondary | ICD-10-CM | POA: Diagnosis not present

## 2021-07-03 MED ORDER — METOPROLOL TARTRATE 25 MG PO TABS
ORAL_TABLET | ORAL | 0 refills | Status: DC
Start: 2021-07-03 — End: 2021-08-10

## 2021-07-03 NOTE — Patient Instructions (Signed)
Medication Instructions:  Your physician recommends that you continue on your current medications as directed. Please refer to the Current Medication list given to you today.  *If you need a refill on your cardiac medications before your next appointment, please call your pharmacy*   Lab Work: Pre procedure labs between 9/19 - 9/30:  BMP & CBC You can stop by the [place] office for this.  You do NOT need to be fasting.  If you have labs (blood work) drawn today and your tests are completely normal, you will receive your results only by: MyChart Message (if you have MyChart) OR A paper copy in the mail If you have any lab test that is abnormal or we need to change your treatment, we will call you to review the results.   Testing/Procedures: Your physician has requested that you have cardiac CT within 7 days PRIOR to your ablation. Cardiac computed tomography (CT) is a painless test that uses an x-ray machine to take clear, detailed pictures of your heart.  Please follow instruction below located under "other instructions". You will get a call from our office to schedule the date for this test.  Your physician has recommended that you have an ablation. Catheter ablation is a medical procedure used to treat some cardiac arrhythmias (irregular heartbeats). During catheter ablation, a long, thin, flexible tube is put into a blood vessel in your groin (upper thigh), or neck. This tube is called an ablation catheter. It is then guided to your heart through the blood vessel. Radio frequency waves destroy small areas of heart tissue where abnormal heartbeats may cause an arrhythmia to start. Please follow instruction below located under "other instructions".   Follow-Up: At [facility], you and your health needs are our priority.  As part of our continuing mission to provide you with exceptional heart care, we have created designated Provider Care Teams.  These Care Teams include your primary  Cardiologist (physician) and Advanced Practice Providers (APPs -  Physician Assistants and Nurse Practitioners) who all work together to provide you with the care you need, when you need it.  Your next appointment:   1 month(s) after your ablation  The format for your next appointment:   In Person  Provider:   AFib clinic   Thank you for choosing [facility]!!   Dory HornSherri Bethel Gaglio, RN [phone]) [phone]    Other Instructions   CT INSTRUCTIONS Your cardiac CT will be scheduled at:  [facility] [address] [place], [place] [address] [phone]  Please arrive at the [facility] main entrance (entrance A) of [facility] 30 minutes prior to test start time. Proceed to the [facility] Radiology Department (first floor) to check-in and test prep.   Please follow these instructions carefully (unless otherwise directed):  Hold all erectile dysfunction medications at least 3 days (72 hrs) prior to test.  On the Night Before the Test: Be sure to Drink plenty of water. Do not consume any caffeinated/decaffeinated beverages or chocolate 12 hours prior to your test. Do not take any antihistamines 12 hours prior to your test.  On the Day of the Test: Drink plenty of water until 1 hour prior to the test. Do not eat any food 4 hours prior to the test. You may take your regular medications prior to the test.  Take metoprolol (Lopressor) 25 mg two hours prior to test. HOLD Furosemide/Hydrochlorothiazide morning of the test.       After the Test: Drink plenty  of water. After receiving IV contrast, you may experience a mild flushed feeling. This is normal. On occasion, you may experience a mild rash up to 24 hours after the test. This is not dangerous. If this occurs, you can take Benadryl 25 mg and increase your fluid intake. If you experience trouble breathing, this can be serious. If it is severe call 911 IMMEDIATELY. If it is mild, please call our office. If  you take any of these medications: Glipizide/Metformin, Avandament, Glucavance, please do not take 48 hours after completing test unless otherwise instructed.   Once we have confirmed authorization from your insurance company, we will call you to set up a date and time for your test. Based on how quickly your insurance processes prior authorizations requests, please allow up to 4 weeks to be contacted for scheduling your Cardiac CT appointment. Be advised that routine Cardiac CT appointments could be scheduled as many as 8 weeks after your provider has ordered it.  For non-scheduling related questions, please contact the cardiac imaging nurse navigator should you have any questions/concerns: Rockwell Alexandria, Cardiac Imaging Nurse Navigator Larey Brick, Cardiac Imaging Nurse Navigator [facility] Direct Office Dial: [phone]   For scheduling needs, including cancellations and rescheduling, please call [place], [phone].     Electrophysiology/Ablation Procedure Instructions   You are scheduled for a(n)  ablation on 08/02/2021 with Dr. Loman Brooklyn.   1.   Pre procedure testing-             A.  LAB WORK ---  between 9/19 - 9/30 for your pre procedure blood work.  You can stop by the [facility] office, you do NOT need to be fasting.   On the day of your procedure 08/02/2021 you will go to [facility] [phone]) at 6:30 am.  Bonita Quin will go to the main entrance A [employer]) and enter where the [employer] are.  Your driver will drop you off and you will head down the hallway to ADMITTING.  You may have one support person come in to the hospital with you.  They will be asked to wait in the waiting room. It is OK to have someone drop you off and come back when you are ready to be discharged.   3.   Do not eat or drink after midnight prior to your procedure.   4.   On the morning of your procedure do NOT take any medication. Do not miss any  doses of your blood thinner prior to the morning of your procedure or your procedure will need to be rescheduled.   5.  Plan for an overnight stay but you may be discharged after your procedure, if you use your phone frequently bring your phone charger. If you are discharged after your procedure you will need someone to drive you home and be with you for 24 hours after your procedure.   6. You will follow up with the AFIB clinic 4 weeks after your procedure.  You will follow up with Dr. Elberta Fortis  3 months after your procedure.  These appointments will be made for you.   7. FYI: For your safety, and to allow [place] to monitor your vital signs accurately during the surgery/procedure we request that if you have artificial nails, gel coating, SNS etc. Please have those removed prior to your surgery/procedure. Not having the nail coverings /polish removed may result in cancellation or delay of your surgery/procedure.  * If  you have ANY questions please call the office [phone] and ask for Wilber Fini RN or send me a MyChart message   * Occasionally, EP Studies and ablations can become lengthy.  Please make your family aware of this before your procedure starts.  Average time ranges from 2-8 hours for EP studies/ablations.  Your physician will call your family after the procedure with the results.                                   Cardiac Ablation Cardiac ablation is a procedure to destroy (ablate) some heart tissue that is sending bad signals. These bad signals cause problems in heart rhythm. The heart has many areas that make these signals. If there are problems in these areas, they can make the heart beat in a way that is not normal. Destroying some tissues can help make the heart rhythm normal. Tell your doctor about: Any allergies you have. All medicines you are taking. These include vitamins, herbs, eye drops, creams, and over-the-counter medicines. Any problems you or family members have had with  medicines that make you fall asleep (anesthetics). Any blood disorders you have. Any surgeries you have had. Any medical conditions you have, such as kidney failure. Whether you are pregnant or may be pregnant. What are the risks? This is a safe procedure. But problems may occur, including: Infection. Bruising and bleeding. Bleeding into the chest. Stroke or blood clots. Damage to nearby areas of your body. Allergies to medicines or dyes. The need for a pacemaker if the normal system is damaged. Failure of the procedure to treat the problem. What happens before the procedure? Medicines Ask your doctor about: Changing or stopping your normal medicines. This is important. Taking aspirin and ibuprofen. Do not take these medicines unless your doctor tells you to take them. Taking other medicines, vitamins, herbs, and supplements. General instructions Follow instructions from your doctor about what you cannot eat or drink. Plan to have someone take you home from the hospital or clinic. If you will be going home right after the procedure, plan to have someone with you for 24 hours. Ask your doctor what steps will be taken to prevent infection. What happens during the procedure?  An IV tube will be put into one of your veins. You will be given a medicine to help you relax. The skin on your neck or groin will be numbed. A cut (incision) will be made in your neck or groin. A needle will be put through your cut and into a large vein. A tube (catheter) will be put into the needle. The tube will be moved to your heart. Dye may be put through the tube. This helps your doctor see your heart. Small devices (electrodes) on the tube will send out signals. A type of energy will be used to destroy some heart tissue. The tube will be taken out. Pressure will be held on your cut. This helps stop bleeding. A bandage will be put over your cut. The exact procedure may vary among doctors and  hospitals. What happens after the procedure? You will be watched until you leave the hospital or clinic. This includes checking your heart rate, breathing rate, oxygen, and blood pressure. Your cut will be watched for bleeding. You will need to lie still for a few hours. Do not drive for 24 hours or as long as your doctor tells you. Summary Cardiac ablation  is a procedure to destroy some heart tissue. This is done to treat heart rhythm problems. Tell your doctor about any medical conditions you may have. Tell him or her about all medicines you are taking to treat them. This is a safe procedure. But problems may occur. These include infection, bruising, bleeding, and damage to nearby areas of your body. Follow what your doctor tells you about food and drink. You may also be told to change or stop some of your medicines. After the procedure, do not drive for 24 hours or as long as your doctor tells you. This information is not intended to replace advice given to you by your health care provider. Make sure you discuss any questions you have with your health care provider. Document Revised: 09/10/2019 Document Reviewed: 09/10/2019 Elsevier Patient Education  2022 [employer].

## 2021-07-03 NOTE — H&P (View-Only) (Signed)
 Electrophysiology Office Note   Date:  07/03/2021   ID:  Dennis [name], DOB 08/04/1966, MRN [id number]  PCP:  Redding, John F. II, MD  Cardiologist: Krasowski Primary Electrophysiologist:  Marcele Kosta Martin Randye Treichler, MD    Chief Complaint: Atrial fibrillation   History of Present Illness: Dennis [name] is a 55 y.o. male who is being seen today for the evaluation of atrial fibrillation at the request of Robert Krasowski. Presenting today for electrophysiology evaluation.  He has a history significant for SVT suppressed with beta-blockers.  He went to the hospital due to palpitations.  He was found to be in atrial fibrillation.  He had a cardioversion, though unfortunately did not convert to normal rhythm.  Today, he denies symptoms of chest pain, shortness of breath, orthopnea, PND, lower extremity edema, claudication, dizziness, presyncope, syncope, bleeding, or neurologic sequela. The patient is tolerating medications without difficulties.  He first noted atrial fibrillation on July 4.  He states that he had a very busy day.  At the end of the day, he went to shower and noted palpitations.  He tried to take his heart rate multiple times, but his apple watch would not register.  He sat down for approximately 20 minutes, rechecked his heart rate, and was noted to be rapid and irregular.  Since then, he has overall done well, though potentially has some fatigue.  He is well rate controlled and does not have palpitations.   Past Medical History:  Diagnosis Date   Asthma    History of kidney stones    Renal disorder    kidney stones   Past Surgical History:  Procedure Laterality Date   CARDIOVERSION N/A 06/19/2021   Procedure: CARDIOVERSION;  Surgeon: Hilty, Kenneth C, MD;  Location: [facility];  Service: Cardiovascular;  Laterality: N/A;   EXTRACORPOREAL SHOCK WAVE LITHOTRIPSY Left 03/28/2017   Procedure: LEFT EXTRACORPOREAL SHOCK WAVE LITHOTRIPSY (ESWL);  Surgeon: Dahlstedt, Stephen, MD;   Location: [facility] ORS;  Service: Urology;  Laterality: Left;   LITHOTRIPSY     STENT PLACE LEFT URETER ([facility] HX) Bilateral      Current Outpatient Medications  Medication Sig Dispense Refill   apixaban (ELIQUIS) 5 MG TABS tablet TAKE 1 TABLET BY MOUTH TWICE A DAY 60 tablet 6   cetirizine (ZYRTEC) 10 MG tablet Take 1 tablet (10 mg total) by mouth daily. 30 tablet 11   diltiazem (CARDIZEM) 30 MG tablet Take 30 mg by mouth every 6 (six) hours as needed (heart rate>100.).     fluticasone (FLONASE) 50 MCG/ACT nasal spray Place 2 sprays into both nostrils daily. 16 g 6   metoprolol succinate (TOPROL-XL) 50 MG 24 hr tablet Take 50 mg by mouth in the morning. Take with or immediately following a meal.     metoprolol tartrate (LOPRESSOR) 25 MG tablet Take 1 tablet (25 mg total) once -- take 2 hours prior to CT testing. 1 tablet 0   PROAIR HFA 108 (90 Base) MCG/ACT inhaler Inhale 2 puffs into the lungs every 6 (six) hours as needed for wheezing or shortness of breath.  5   No current facility-administered medications for this visit.    Allergies:   Patient has no known allergies.   Social History:  The patient  reports that he has been smoking cigars. He has never used smokeless tobacco. He reports that he does not drink alcohol and does not use drugs.   Family History:  The patient's family history includes Heart disease in his father; Lung   cancer in his paternal grandfather.    ROS:  Please see the history of present illness.   Otherwise, review of systems is positive for none.   All other systems are reviewed and negative.    PHYSICAL EXAM: VS:  BP 106/80   Pulse 91   Ht 6\' 2"  (1.88 m)   Wt 232 lb (105.2 kg)   SpO2 97%   BMI 29.79 kg/m  , BMI Body mass index is 29.79 kg/m. GEN: Well nourished, well developed, in no acute distress  HEENT: normal  Neck: no JVD, carotid bruits, or masses Cardiac: Irregular RRR; no murmurs, rubs, or gallops,no edema  Respiratory:  clear to auscultation  bilaterally, normal work of breathing GI: soft, nontender, nondistended, + BS MS: no deformity or atrophy  Skin: warm and dry Neuro:  Strength and sensation are intact Psych: euthymic mood, full affect  EKG:  EKG is not ordered today. Personal review of the ekg ordered 06/20/2021 shows atrial fibrillation  Recent Labs: 07/04/2020: ALT 23; TSH 0.994 05/30/2021: BUN 11; Creatinine, Ser 1.14; Hemoglobin 16.1; Platelets 256; Potassium 4.7; Sodium 146    Lipid Panel  No results found for: CHOL, TRIG, HDL, CHOLHDL, VLDL, LDLCALC, LDLDIRECT   Wt Readings from Last 3 Encounters:  07/03/21 232 lb (105.2 kg)  06/20/21 235 lb 3.2 oz (106.7 kg)  06/19/21 237 lb (107.5 kg)      Other studies Reviewed: Additional studies/ records that were reviewed today include: Stress echo 2019 Review of the above records today demonstrates:  - Stress ECG conclusions: There were no stress arrhythmias or    conduction abnormalities. The stress ECG was normal.  - Staged echo: There was no echocardiographic evidence for    stress-induced ischemia.    ASSESSMENT AND PLAN:  1.  Persistent atrial fibrillation: Unfortunately had an unsuccessful cardioversion.  Is on Eliquis 5 mg twice daily, Toprol-XL 50 mg daily, diltiazem 30 mg as needed.  CHA2DS2-VASc of 0.  He has palpitations and possibly some fatigue due to his atrial fibrillation.  He would like to get back into normal rhythm.  We did speak about medication management as well as ablation.  He would like to try and avoid medications if possible.  Due to that, we Honey Zakarian plan for A. fib ablation.  Risk, benefits, and alternatives to EP study and radiofrequency ablation for afib were also discussed in detail today. These risks include but are not limited to stroke, bleeding, vascular damage, tamponade, perforation, damage to the esophagus, lungs, and other structures, pulmonary vein stenosis, worsening renal function, and death. The patient understands these risk  and wishes to proceed.  We Sira Adsit therefore proceed with catheter ablation at the next available time.  Carto, ICE, anesthesia are requested for the procedure.  Camyla Camposano also obtain CT PV protocol prior to the procedure to exclude LAA thrombus and further evaluate atrial anatomy.   2.  Obstructive sleep apnea: CPAP compliance encouraged  Case discussed with primary cardiology  Current medicines are reviewed at length with the patient today.   The patient does not have concerns regarding his medicines.  The following changes were made today:  none  Labs/ tests ordered today include:  Orders Placed This Encounter  Procedures   CT CARDIAC MORPH/PULM VEIN W/CM&W/O CA SCORE   Basic metabolic panel   CBC     Disposition:   FU with Johanny Segers 3 months  Signed, Keyara Ent 2020, MD  07/03/2021 1:33 PM     [facility] [address] 09/02/2021  [address] [place] [address] [phone] (office) [phone] (fax)

## 2021-07-03 NOTE — Addendum Note (Signed)
Addended by: Baird Lyons on: 07/03/2021 05:18 PM   Modules accepted: Orders

## 2021-07-03 NOTE — Progress Notes (Signed)
Electrophysiology Office Note   Date:  07/03/2021   ID:  Dennis [name], DOB Sep 12, 1966, MRN [id number]  PCP:  Noni Saupe, MD  Cardiologist: Bing Matter Primary Electrophysiologist:  Malessa Zartman Jorja Loa, MD    Chief Complaint: Atrial fibrillation   History of Present Illness: Dennis [name] is a 55 y.o. male who is being seen today for the evaluation of atrial fibrillation at the request of Gypsy Balsam. Presenting today for electrophysiology evaluation.  He has a history significant for SVT suppressed with beta-blockers.  He went to the hospital due to palpitations.  He was found to be in atrial fibrillation.  He had a cardioversion, though unfortunately did not convert to normal rhythm.  Today, he denies symptoms of chest pain, shortness of breath, orthopnea, PND, lower extremity edema, claudication, dizziness, presyncope, syncope, bleeding, or neurologic sequela. The patient is tolerating medications without difficulties.  He first noted atrial fibrillation on July 4.  He states that he had a very busy day.  At the end of the day, he went to shower and noted palpitations.  He tried to take his heart rate multiple times, but his apple watch would not register.  He sat down for approximately 20 minutes, rechecked his heart rate, and was noted to be rapid and irregular.  Since then, he has overall done well, though potentially has some fatigue.  He is well rate controlled and does not have palpitations.   Past Medical History:  Diagnosis Date   Asthma    History of kidney stones    Renal disorder    kidney stones   Past Surgical History:  Procedure Laterality Date   CARDIOVERSION N/A 06/19/2021   Procedure: CARDIOVERSION;  Surgeon: [name], MD;  Location: [facility] ENDOSCOPY;  Service: Cardiovascular;  Laterality: N/A;   EXTRACORPOREAL SHOCK WAVE LITHOTRIPSY Left 03/28/2017   Procedure: LEFT EXTRACORPOREAL SHOCK WAVE LITHOTRIPSY (ESWL);  Surgeon: Marcine Matar, MD;   Location: [facility] ORS;  Service: Urology;  Laterality: Left;   LITHOTRIPSY     STENT PLACE LEFT URETER ([facility] HX) Bilateral      Current Outpatient Medications  Medication Sig Dispense Refill   apixaban (ELIQUIS) 5 MG TABS tablet TAKE 1 TABLET BY MOUTH TWICE A DAY 60 tablet 6   cetirizine (ZYRTEC) 10 MG tablet Take 1 tablet (10 mg total) by mouth daily. 30 tablet 11   diltiazem (CARDIZEM) 30 MG tablet Take 30 mg by mouth every 6 (six) hours as needed (heart rate>100.).     fluticasone (FLONASE) 50 MCG/ACT nasal spray Place 2 sprays into both nostrils daily. 16 g 6   metoprolol succinate (TOPROL-XL) 50 MG 24 hr tablet Take 50 mg by mouth in the morning. Take with or immediately following a meal.     metoprolol tartrate (LOPRESSOR) 25 MG tablet Take 1 tablet (25 mg total) once -- take 2 hours prior to CT testing. 1 tablet 0   PROAIR HFA 108 (90 Base) MCG/ACT inhaler Inhale 2 puffs into the lungs every 6 (six) hours as needed for wheezing or shortness of breath.  5   No current facility-administered medications for this visit.    Allergies:   Patient has no known allergies.   Social History:  The patient  reports that he has been smoking cigars. He has never used smokeless tobacco. He reports that he does not drink alcohol and does not use drugs.   Family History:  The patient's family history includes Heart disease in his father; Lung  cancer in his paternal grandfather.    ROS:  Please see the history of present illness.   Otherwise, review of systems is positive for none.   All other systems are reviewed and negative.    PHYSICAL EXAM: VS:  BP 106/80   Pulse 91   Ht 6\' 2"  (1.88 m)   Wt 232 lb (105.2 kg)   SpO2 97%   BMI 29.79 kg/m  , BMI Body mass index is 29.79 kg/m. GEN: Well nourished, well developed, in no acute distress  HEENT: normal  Neck: no JVD, carotid bruits, or masses Cardiac: Irregular RRR; no murmurs, rubs, or gallops,no edema  Respiratory:  clear to auscultation  bilaterally, normal work of breathing GI: soft, nontender, nondistended, + BS MS: no deformity or atrophy  Skin: warm and dry Neuro:  Strength and sensation are intact Psych: euthymic mood, full affect  EKG:  EKG is not ordered today. Personal review of the ekg ordered 06/20/2021 shows atrial fibrillation  Recent Labs: 07/04/2020: ALT 23; TSH 0.994 05/30/2021: BUN 11; Creatinine, Ser 1.14; Hemoglobin 16.1; Platelets 256; Potassium 4.7; Sodium 146    Lipid Panel  No results found for: CHOL, TRIG, HDL, CHOLHDL, VLDL, LDLCALC, LDLDIRECT   Wt Readings from Last 3 Encounters:  07/03/21 232 lb (105.2 kg)  06/20/21 235 lb 3.2 oz (106.7 kg)  06/19/21 237 lb (107.5 kg)      Other studies Reviewed: Additional studies/ records that were reviewed today include: Stress echo 2019 Review of the above records today demonstrates:  - Stress ECG conclusions: There were no stress arrhythmias or    conduction abnormalities. The stress ECG was normal.  - Staged echo: There was no echocardiographic evidence for    stress-induced ischemia.    ASSESSMENT AND PLAN:  1.  Persistent atrial fibrillation: Unfortunately had an unsuccessful cardioversion.  Is on Eliquis 5 mg twice daily, Toprol-XL 50 mg daily, diltiazem 30 mg as needed.  CHA2DS2-VASc of 0.  He has palpitations and possibly some fatigue due to his atrial fibrillation.  He would like to get back into normal rhythm.  We did speak about medication management as well as ablation.  He would like to try and avoid medications if possible.  Due to that, we [name] plan for A. fib ablation.  Risk, benefits, and alternatives to EP study and radiofrequency ablation for afib were also discussed in detail today. These risks include but are not limited to stroke, bleeding, vascular damage, tamponade, perforation, damage to the esophagus, lungs, and other structures, pulmonary vein stenosis, worsening renal function, and death. The patient understands these risk  and wishes to proceed.  We [name] therefore proceed with catheter ablation at the next available time.  Carto, ICE, anesthesia are requested for the procedure.  [name] also obtain CT PV protocol prior to the procedure to exclude LAA thrombus and further evaluate atrial anatomy.   2.  Obstructive sleep apnea: CPAP compliance encouraged  Case discussed with primary cardiology  Current medicines are reviewed at length with the patient today.   The patient does not have concerns regarding his medicines.  The following changes were made today:  none  Labs/ tests ordered today include:  Orders Placed This Encounter  Procedures   CT CARDIAC MORPH/PULM VEIN W/CM&W/O CA SCORE   Basic metabolic panel   CBC     Disposition:   FU with Izacc Demeyer 3 months  Signed, Chaniece Barbato 2020, MD  07/03/2021 1:33 PM     [facility] 09/02/2021  [address] [place] [address] [phone] (office) [phone] (fax)

## 2021-07-04 ENCOUNTER — Telehealth (HOSPITAL_COMMUNITY): Payer: Self-pay | Admitting: Emergency Medicine

## 2021-07-04 DIAGNOSIS — J0100 Acute maxillary sinusitis, unspecified: Secondary | ICD-10-CM | POA: Diagnosis not present

## 2021-07-04 DIAGNOSIS — R051 Acute cough: Secondary | ICD-10-CM | POA: Diagnosis not present

## 2021-07-04 DIAGNOSIS — I4819 Other persistent atrial fibrillation: Secondary | ICD-10-CM | POA: Diagnosis not present

## 2021-07-04 DIAGNOSIS — R0981 Nasal congestion: Secondary | ICD-10-CM | POA: Diagnosis not present

## 2021-07-04 DIAGNOSIS — Z79899 Other long term (current) drug therapy: Secondary | ICD-10-CM | POA: Diagnosis not present

## 2021-07-04 NOTE — Telephone Encounter (Signed)
Attempted to call patient regarding upcoming cardiac CT appointment. Left message on voicemail with name and callback number Rockwell Alexandria RN Navigator Cardiac Imaging [facility] [phone] Office [phone] Cell  Requested labs

## 2021-07-05 LAB — BASIC METABOLIC PANEL
BUN/Creatinine Ratio: 11 (ref 9–20)
BUN: 13 mg/dL (ref 6–24)
CO2: 24 mmol/L (ref 20–29)
Calcium: 9.6 mg/dL (ref 8.7–10.2)
Chloride: 102 mmol/L (ref 96–106)
Creatinine, Ser: 1.16 mg/dL (ref 0.76–1.27)
Glucose: 89 mg/dL (ref 65–99)
Potassium: 4.4 mmol/L (ref 3.5–5.2)
Sodium: 141 mmol/L (ref 134–144)
eGFR: 75 mL/min/{1.73_m2} (ref >59)

## 2021-07-05 LAB — CBC
Hematocrit: 44.1 % (ref 37.5–51.0)
Hemoglobin: 15.4 g/dL (ref 13.0–17.7)
MCH: 31.1 pg (ref 26.6–33.0)
MCHC: 34.9 g/dL (ref 31.5–35.7)
MCV: 89 fL (ref 79–97)
Platelets: 222 10*3/uL (ref 150–450)
RBC: 4.95 x10E6/uL (ref 4.14–5.80)
RDW: 12.2 % (ref 11.6–15.4)
WBC: 11.3 10*3/uL — ABNORMAL HIGH (ref 3.4–10.8)

## 2021-07-06 ENCOUNTER — Ambulatory Visit (HOSPITAL_COMMUNITY)
Admission: RE | Admit: 2021-07-06 | Discharge: 2021-07-06 | Disposition: A | Discharge status: Home / Self Care | Payer: BC Managed Care – PPO | Source: Ambulatory Visit | Attending: Cardiology | Admitting: Cardiology

## 2021-07-06 ENCOUNTER — Encounter (HOSPITAL_COMMUNITY): Payer: Self-pay

## 2021-07-06 ENCOUNTER — Other Ambulatory Visit: Payer: Self-pay

## 2021-07-06 DIAGNOSIS — I4819 Other persistent atrial fibrillation: Secondary | ICD-10-CM | POA: Insufficient documentation

## 2021-07-06 MED ORDER — DILTIAZEM HCL 25 MG/5ML IV SOLN: 10.0000 mg | Freq: Once | INTRAVENOUS | Status: AC

## 2021-07-06 MED ORDER — DILTIAZEM HCL 25 MG/5ML IV SOLN
INTRAVENOUS | Status: AC
  Administered 2021-07-06: 10 mg via INTRAVENOUS
  Filled 2021-07-06: qty 5

## 2021-07-06 MED ORDER — IOHEXOL 350 MG/ML SOLN
100.0000 mL | Freq: Once | INTRAVENOUS | Status: AC | PRN
  Administered 2021-07-06: 100 mL via INTRAVENOUS

## 2021-07-10 ENCOUNTER — Other Ambulatory Visit: Payer: Self-pay | Admitting: *Deleted

## 2021-07-10 DIAGNOSIS — I4819 Other persistent atrial fibrillation: Secondary | ICD-10-CM

## 2021-07-10 DIAGNOSIS — R9389 Abnormal findings on diagnostic imaging of other specified body structures: Secondary | ICD-10-CM

## 2021-07-11 ENCOUNTER — Other Ambulatory Visit: Payer: Self-pay

## 2021-07-11 ENCOUNTER — Ambulatory Visit (HOSPITAL_COMMUNITY)
Admission: RE | Admit: 2021-07-11 | Discharge: 2021-07-11 | Disposition: A | Discharge status: Home / Self Care | Payer: BC Managed Care – PPO | Source: Ambulatory Visit | Attending: Cardiology | Admitting: Cardiology

## 2021-07-11 ENCOUNTER — Ambulatory Visit (HOSPITAL_BASED_OUTPATIENT_CLINIC_OR_DEPARTMENT_OTHER): Payer: BC Managed Care – PPO | Admitting: Cardiovascular Disease

## 2021-07-11 DIAGNOSIS — I4819 Other persistent atrial fibrillation: Secondary | ICD-10-CM | POA: Diagnosis not present

## 2021-07-11 DIAGNOSIS — G4719 Other hypersomnia: Secondary | ICD-10-CM

## 2021-07-11 DIAGNOSIS — G4731 Primary central sleep apnea: Secondary | ICD-10-CM | POA: Diagnosis not present

## 2021-07-11 DIAGNOSIS — R9389 Abnormal findings on diagnostic imaging of other specified body structures: Secondary | ICD-10-CM | POA: Insufficient documentation

## 2021-07-11 DIAGNOSIS — Q279 Congenital malformation of peripheral vascular system, unspecified: Secondary | ICD-10-CM | POA: Diagnosis not present

## 2021-07-11 DIAGNOSIS — I4891 Unspecified atrial fibrillation: Secondary | ICD-10-CM | POA: Diagnosis not present

## 2021-07-11 DIAGNOSIS — G4733 Obstructive sleep apnea (adult) (pediatric): Secondary | ICD-10-CM | POA: Diagnosis not present

## 2021-07-11 HISTORY — DX: Other hypersomnia: G47.19

## 2021-07-11 MED ORDER — IOHEXOL 350 MG/ML SOLN
80.0000 mL | Freq: Once | INTRAVENOUS | Status: AC | PRN
  Administered 2021-07-11: 80 mL via INTRAVENOUS

## 2021-07-13 ENCOUNTER — Ambulatory Visit (HOSPITAL_COMMUNITY): Payer: BC Managed Care – PPO | Admitting: Anesthesiology

## 2021-07-13 ENCOUNTER — Ambulatory Visit (HOSPITAL_COMMUNITY)
Admission: RE | Admit: 2021-07-13 | Discharge: 2021-07-13 | Disposition: A | Discharge status: Home / Self Care | Payer: BC Managed Care – PPO | Attending: Cardiology | Admitting: Cardiology

## 2021-07-13 ENCOUNTER — Encounter (HOSPITAL_COMMUNITY)
Admission: RE | Disposition: A | Discharge status: Home / Self Care | Payer: Self-pay | Source: Home / Self Care | Attending: Cardiology

## 2021-07-13 ENCOUNTER — Other Ambulatory Visit: Payer: Self-pay

## 2021-07-13 DIAGNOSIS — Z79899 Other long term (current) drug therapy: Secondary | ICD-10-CM | POA: Insufficient documentation

## 2021-07-13 DIAGNOSIS — F17290 Nicotine dependence, other tobacco product, uncomplicated: Secondary | ICD-10-CM | POA: Insufficient documentation

## 2021-07-13 DIAGNOSIS — I4819 Other persistent atrial fibrillation: Secondary | ICD-10-CM | POA: Insufficient documentation

## 2021-07-13 DIAGNOSIS — G4733 Obstructive sleep apnea (adult) (pediatric): Secondary | ICD-10-CM | POA: Insufficient documentation

## 2021-07-13 DIAGNOSIS — I480 Paroxysmal atrial fibrillation: Secondary | ICD-10-CM | POA: Diagnosis not present

## 2021-07-13 DIAGNOSIS — Z8249 Family history of ischemic heart disease and other diseases of the circulatory system: Secondary | ICD-10-CM | POA: Insufficient documentation

## 2021-07-13 DIAGNOSIS — Z7901 Long term (current) use of anticoagulants: Secondary | ICD-10-CM | POA: Diagnosis not present

## 2021-07-13 DIAGNOSIS — Z87442 Personal history of urinary calculi: Secondary | ICD-10-CM | POA: Diagnosis not present

## 2021-07-13 HISTORY — PX: ATRIAL FIBRILLATION ABLATION: EP1191

## 2021-07-13 LAB — POCT ACTIVATED CLOTTING TIME
Activated Clotting Time: 283 seconds
Activated Clotting Time: 294 seconds
Activated Clotting Time: 318 seconds

## 2021-07-13 SURGERY — ATRIAL FIBRILLATION ABLATION
Anesthesia: General

## 2021-07-13 MED ORDER — ONDANSETRON HCL 4 MG/2ML IJ SOLN
INTRAMUSCULAR | Status: DC | PRN
  Administered 2021-07-13: 4 mg via INTRAVENOUS

## 2021-07-13 MED ORDER — PROPOFOL 10 MG/ML IV BOLUS
INTRAVENOUS | Status: DC | PRN
  Administered 2021-07-13: 180 ug via INTRAVENOUS

## 2021-07-13 MED ORDER — ALBUMIN HUMAN 5 % IV SOLN
INTRAVENOUS | Status: AC
  Filled 2021-07-13: qty 250

## 2021-07-13 MED ORDER — DOBUTAMINE INFUSION FOR EP/ECHO/NUC (1000 MCG/ML)
INTRAVENOUS | Status: DC | PRN
  Administered 2021-07-13: 20 ug/kg/min via INTRAVENOUS

## 2021-07-13 MED ORDER — SODIUM CHLORIDE 0.9% FLUSH: 3.0000 mL | INTRAVENOUS | Status: DC | PRN

## 2021-07-13 MED ORDER — HEPARIN (PORCINE) IN NACL 1000-0.9 UT/500ML-% IV SOLN
INTRAVENOUS | Status: DC | PRN
  Administered 2021-07-13 (×5): 500 mL

## 2021-07-13 MED ORDER — HEPARIN (PORCINE) IN NACL 1000-0.9 UT/500ML-% IV SOLN
INTRAVENOUS | Status: AC
  Filled 2021-07-13: qty 2000

## 2021-07-13 MED ORDER — PHENYLEPHRINE HCL-NACL 20-0.9 MG/250ML-% IV SOLN
INTRAVENOUS | Status: DC | PRN
  Administered 2021-07-13: 25 ug/min via INTRAVENOUS

## 2021-07-13 MED ORDER — ALBUMIN HUMAN 5 % IV SOLN
12.5000 g | Freq: Once | INTRAVENOUS | Status: AC
  Administered 2021-07-13: 12.5 g via INTRAVENOUS
  Filled 2021-07-13: qty 250

## 2021-07-13 MED ORDER — HEPARIN SODIUM (PORCINE) 1000 UNIT/ML IJ SOLN
INTRAMUSCULAR | Status: DC | PRN
  Administered 2021-07-13: 1000 [IU] via INTRAVENOUS

## 2021-07-13 MED ORDER — ROCURONIUM BROMIDE 10 MG/ML (PF) SYRINGE
PREFILLED_SYRINGE | INTRAVENOUS | Status: DC | PRN
  Administered 2021-07-13: 80 mg via INTRAVENOUS

## 2021-07-13 MED ORDER — HEPARIN SODIUM (PORCINE) 1000 UNIT/ML IJ SOLN
INTRAMUSCULAR | Status: AC
  Filled 2021-07-13: qty 1

## 2021-07-13 MED ORDER — ACETAMINOPHEN 325 MG PO TABS: 650.0000 mg | ORAL_TABLET | ORAL | Status: DC | PRN

## 2021-07-13 MED ORDER — PHENYLEPHRINE 40 MCG/ML (10ML) SYRINGE FOR IV PUSH (FOR BLOOD PRESSURE SUPPORT)
PREFILLED_SYRINGE | INTRAVENOUS | Status: DC | PRN
  Administered 2021-07-13: 120 ug via INTRAVENOUS
  Administered 2021-07-13: 80 ug via INTRAVENOUS
  Administered 2021-07-13: 120 ug via INTRAVENOUS
  Administered 2021-07-13: 80 ug via INTRAVENOUS

## 2021-07-13 MED ORDER — SODIUM CHLORIDE 0.9 % IV BOLUS: 500.0000 mL | Freq: Once | INTRAVENOUS | Status: DC

## 2021-07-13 MED ORDER — DOBUTAMINE INFUSION FOR EP/ECHO/NUC (1000 MCG/ML)
INTRAVENOUS | Status: AC
  Filled 2021-07-13: qty 250

## 2021-07-13 MED ORDER — SUGAMMADEX SODIUM 200 MG/2ML IV SOLN
INTRAVENOUS | Status: DC | PRN
  Administered 2021-07-13: 200 mg via INTRAVENOUS

## 2021-07-13 MED ORDER — ONDANSETRON HCL 4 MG/2ML IJ SOLN: 4.0000 mg | Freq: Four times a day (QID) | INTRAMUSCULAR | Status: DC | PRN

## 2021-07-13 MED ORDER — MIDAZOLAM HCL 2 MG/2ML IJ SOLN
INTRAMUSCULAR | Status: DC | PRN
  Administered 2021-07-13: 2 mg via INTRAVENOUS

## 2021-07-13 MED ORDER — FENTANYL CITRATE (PF) 100 MCG/2ML IJ SOLN
INTRAMUSCULAR | Status: DC | PRN
  Administered 2021-07-13: 25 ug via INTRAVENOUS
  Administered 2021-07-13: 50 ug via INTRAVENOUS
  Administered 2021-07-13: 25 ug via INTRAVENOUS

## 2021-07-13 MED ORDER — SODIUM CHLORIDE 0.9% FLUSH: 3.0000 mL | Freq: Two times a day (BID) | INTRAVENOUS | Status: DC

## 2021-07-13 MED ORDER — HEPARIN SODIUM (PORCINE) 1000 UNIT/ML IJ SOLN
INTRAMUSCULAR | Status: DC | PRN
  Administered 2021-07-13 (×2): 4000 [IU] via INTRAVENOUS
  Administered 2021-07-13: 14000 [IU] via INTRAVENOUS

## 2021-07-13 MED ORDER — SODIUM CHLORIDE 0.9 % IV SOLN: 250.0000 mL | INTRAVENOUS | Status: DC | PRN

## 2021-07-13 MED ORDER — PROTAMINE SULFATE 10 MG/ML IV SOLN
INTRAVENOUS | Status: DC | PRN
  Administered 2021-07-13: 40 mg via INTRAVENOUS

## 2021-07-13 MED ORDER — SODIUM CHLORIDE 0.9 % IV SOLN: INTRAVENOUS | Status: DC

## 2021-07-13 MED ORDER — LIDOCAINE 2% (20 MG/ML) 5 ML SYRINGE
INTRAMUSCULAR | Status: DC | PRN
  Administered 2021-07-13: 100 mg via INTRAVENOUS

## 2021-07-13 MED ORDER — DEXAMETHASONE SODIUM PHOSPHATE 10 MG/ML IJ SOLN
INTRAMUSCULAR | Status: DC | PRN
  Administered 2021-07-13: 8 mg via INTRAVENOUS

## 2021-07-13 SURGICAL SUPPLY — 20 items
BAG SNAP BAND KOVER 36X36 (MISCELLANEOUS) ×2 IMPLANT
CATH 8FR REPROCESSED SOUNDSTAR (CATHETERS) ×2 IMPLANT
CATH OCTARAY 2.0 F 3-3-3-3-3 (CATHETERS) ×2 IMPLANT
CATH S CIRCA THERM PROBE 10F (CATHETERS) ×2 IMPLANT
CATH SMTCH THERMOCOOL SF DF (CATHETERS) ×2 IMPLANT
CATH WEB BI DIR CSDF CRV REPRO (CATHETERS) ×2 IMPLANT
CLOSURE PERCLOSE PROSTYLE (VASCULAR PRODUCTS) ×8 IMPLANT
COVER SWIFTLINK CONNECTOR (BAG) ×2 IMPLANT
MAT PREVALON FULL STRYKER (MISCELLANEOUS) ×2 IMPLANT
NEEDLE BAYLIS TRANSSEPTAL 71CM (NEEDLE) ×2 IMPLANT
PACK EP LATEX FREE (CUSTOM PROCEDURE TRAY) ×2
PACK EP LF (CUSTOM PROCEDURE TRAY) ×1 IMPLANT
PAD PRO RADIOLUCENT 2001M-C (PAD) ×2 IMPLANT
PATCH CARTO3 (PAD) ×2 IMPLANT
SHEATH CARTO VIZIGO SM CVD (SHEATH) ×2 IMPLANT
SHEATH PINNACLE 7F 10CM (SHEATH) ×2 IMPLANT
SHEATH PINNACLE 8F 10CM (SHEATH) ×4 IMPLANT
SHEATH PINNACLE 9F 10CM (SHEATH) ×2 IMPLANT
SHEATH SWARTZ TS SL2 63CM 8.5F (SHEATH) ×2 IMPLANT
TUBING SMART ABLATE COOLFLOW (TUBING) ×2 IMPLANT

## 2021-07-13 NOTE — Discharge Instructions (Addendum)
Post procedure care instructions No driving for 4 days. No lifting over 5 lbs for 1 week. No vigorous or sexual activity for 1 week. You may return to work/your usual activities on 07/21/21. Keep procedure site clean & dry. If you notice increased pain, swelling, bleeding or pus, call/return!  You may shower after 24 hours, but no soaking in baths/hot tubs/pools for 1 week.    You have an appointment set up with the Atrial Fibrillation Clinic.  Multiple studies have shown that being followed by a dedicated atrial fibrillation clinic in addition to the standard care you receive from your other physicians improves health. We believe that enrollment in the atrial fibrillation clinic will allow [place] to better care for you.   The phone number to the Atrial Fibrillation Clinic is [phone]. The clinic is staffed Monday through Friday from 8:30am to 5pm.  Parking Directions: The clinic is located in the Heart and Vascular Building connected to [facility]. 1)From [address] turn on to [address] and go to the 3rd entrance  (Heart and Vascular entrance) on the right. 2)Look to the right for Heart &Vascular Parking Garage. 3)A code for the entrance is required, for Oct is 3333.   4)Take the elevators to the 1st floor. Registration is in the room with the glass walls at the end of the hallway.  If you have any trouble parking or locating the clinic, please don't hesitate to call [phone].   Cardiac Ablation, Care After  This sheet gives you information about how to care for yourself after your procedure. Your health care provider may also give you more specific instructions. If you have problems or questions, contact your health care provider. What can I expect after the procedure? After the procedure, it is common to have: Bruising around your puncture site. Tenderness around your puncture site. Skipped heartbeats. Tiredness (fatigue).  Follow these instructions at  home: Puncture site care  Follow instructions from your health care provider about how to take care of your puncture site. Make sure you: If present, leave stitches (sutures), skin glue, or adhesive strips in place. These skin closures may need to stay in place for up to 2 weeks. If adhesive strip edges start to loosen and curl up, you may trim the loose edges. Do not remove adhesive strips completely unless your health care provider tells you to do that. If a large square bandage is present, this may be removed 24 hours after surgery.  Check your puncture site every day for signs of infection. Check for: Redness, swelling, or pain. Fluid or blood. If your puncture site starts to bleed, lie down on your back, apply firm pressure to the area, and contact your health care provider. Warmth. Pus or a bad smell. Driving Do not drive for at least 4 days after your procedure or however long your health care provider recommends. (Do not resume driving if you have previously been instructed not to drive for other health reasons.) Do not drive or use heavy machinery while taking prescription pain medicine. Activity Avoid activities that take a lot of effort for at least 7 days after your procedure. Do not lift anything that is heavier than 5 lb (4.5 kg) for one week.  No sexual activity for 1 week.  Return to your normal activities as told by your health care provider. Ask your health care provider what activities are safe for you. General instructions Take over-the-counter and prescription medicines only as told by your health care  provider. Do not use any products that contain nicotine or tobacco, such as cigarettes and e-cigarettes. If you need help quitting, ask your health care provider. You may shower after 24 hours, but Do not take baths, swim, or use a hot tub for 1 week.  Do not drink alcohol for 24 hours after your procedure. Keep all follow-up visits as told by your health care provider. This  is important. Contact a health care provider if: You have redness, mild swelling, or pain around your puncture site. You have fluid or blood coming from your puncture site that stops after applying firm pressure to the area. Your puncture site feels warm to the touch. You have pus or a bad smell coming from your puncture site. You have a fever. You have chest pain or discomfort that spreads to your neck, jaw, or arm. You are sweating a lot. You feel nauseous. You have a fast or irregular heartbeat. You have shortness of breath. You are dizzy or light-headed and feel the need to lie down. You have pain or numbness in the arm or leg closest to your puncture site. Get help right away if: Your puncture site suddenly swells. Your puncture site is bleeding and the bleeding does not stop after applying firm pressure to the area. These symptoms may represent a serious problem that is an emergency. Do not wait to see if the symptoms will go away. Get medical help right away. Call your local emergency services (911 in the [place] Do not drive yourself to the hospital. Summary After the procedure, it is normal to have bruising and tenderness at the puncture site in your groin, neck, or forearm. Check your puncture site every day for signs of infection. Get help right away if your puncture site is bleeding and the bleeding does not stop after applying firm pressure to the area. This is a medical emergency. This information is not intended to replace advice given to you by your health care provider. Make sure you discuss any questions you have with your health care provider.

## 2021-07-13 NOTE — Progress Notes (Signed)
Pt ambulated without difficulty or bleeding.   Discharged home with his wife who will drive and stay with pt x 24 hrs. 

## 2021-07-13 NOTE — Anesthesia Preprocedure Evaluation (Addendum)
Anesthesia Evaluation  Patient identified by MRN, date of birth, ID band Patient awake    Reviewed: Allergy & Precautions, NPO status , Patient's Chart, lab work & pertinent test results, reviewed documented beta blocker date and time   History of Anesthesia Complications Negative for: history of anesthetic complications  Airway Mallampati: II  TM Distance: >3 FB Neck ROM: Full    Dental no notable dental hx.    Pulmonary asthma , sleep apnea , Current Smoker and Patient abstained from smoking.,   Being worked up for OSA, not yet tested    Pulmonary exam normal        Cardiovascular + dysrhythmias Atrial Fibrillation and Supra Ventricular Tachycardia  Rhythm:Irregular Rate:Normal     Neuro/Psych negative neurological ROS  negative psych ROS   GI/Hepatic negative GI ROS, Neg liver ROS,   Endo/Other   Obesity   Renal/GU negative Renal ROS     Musculoskeletal negative musculoskeletal ROS (+)   Abdominal   Peds  Hematology  On eliquis    Anesthesia Other Findings   Reproductive/Obstetrics                           Anesthesia Physical  Anesthesia Plan  ASA: 3  Anesthesia Plan: General   Post-op Pain Management:    Induction: Intravenous  PONV Risk Score and Plan: 2 and Treatment may vary due to age or medical condition, Ondansetron and Midazolam  Airway Management Planned: Oral ETT and LMA  Additional Equipment: None  Intra-op Plan:   Post-operative Plan: Extubation in OR  Informed Consent: I have reviewed the patients History and Physical, chart, labs and discussed the procedure including the risks, benefits and alternatives for the proposed anesthesia with the patient or authorized representative who has indicated his/her understanding and acceptance.     Dental advisory given  Plan Discussed with: Anesthesiologist and CRNA  Anesthesia Plan Comments:         Anesthesia Quick Evaluation

## 2021-07-13 NOTE — Transfer of Care (Signed)
Immediate Anesthesia Transfer of Care Note  Patient: Dennis [name]  Procedure(s) Performed: ATRIAL FIBRILLATION ABLATION  Patient Location: Cath Lab  Anesthesia Type:General  Level of Consciousness: awake, alert  and oriented  Airway & Oxygen Therapy: Patient Spontanous Breathing  Post-op Assessment: Report given to RN  Post vital signs: Reviewed and unstable  Last Vitals:  Vitals Value Taken Time  BP 82/61 07/13/21 1208  Temp 36.4 C 07/13/21 1157  Pulse 78 07/13/21 1209  Resp 17 07/13/21 1209  SpO2 96 % 07/13/21 1209  Vitals shown include unvalidated device data.  Last Pain:  Vitals:   07/13/21 1157  TempSrc: Temporal  PainSc: 0-No pain         Complications: There were no known notable events for this encounter.

## 2021-07-13 NOTE — Anesthesia Postprocedure Evaluation (Signed)
Anesthesia Post Note  Patient: Dennis [name]  Procedure(s) Performed: ATRIAL FIBRILLATION ABLATION     Patient location during evaluation: PACU Anesthesia Type: General Level of consciousness: sedated Pain management: pain level controlled Vital Signs Assessment: post-procedure vital signs reviewed and stable Respiratory status: spontaneous breathing and respiratory function stable Cardiovascular status: stable Postop Assessment: no apparent nausea or vomiting Anesthetic complications: yes   Encounter Notable Events  Notable Event Outcome Phase Comment  Hypotension  Postprocedure, before discharge     Last Vitals:  Vitals:   07/13/21 1500 07/13/21 1530  BP: (!) 105/58 100/62  Pulse: 95 92  Resp: 19 (!) 24  Temp:    SpO2: 95% 95%    Last Pain:  Vitals:   07/13/21 1400  TempSrc:   PainSc: 0-No pain                 Latricia Cerrito DANIEL

## 2021-07-13 NOTE — Interval H&P Note (Signed)
History and Physical Interval Note:  07/13/2021 9:05 AM  Dennis [name]  has presented today for surgery, with the diagnosis of afib.  The various methods of treatment have been discussed with the patient and family. After consideration of risks, benefits and other options for treatment, the patient has consented to  Procedure(s): ATRIAL FIBRILLATION ABLATION (N/A) as a surgical intervention.  The patient's history has been reviewed, patient examined, no change in status, stable for surgery.  I have reviewed the patient's chart and labs.  Questions were answered to the patient's satisfaction.     Rupinder Livingston [employer]

## 2021-07-13 NOTE — Anesthesia Procedure Notes (Signed)
Procedure Name: Intubation Date/Time: 07/13/2021 9:38 AM Performed by: Barrington Ellison, CRNA Pre-anesthesia Checklist: Patient identified, Emergency Drugs available, Suction available and Patient being monitored Patient Re-evaluated:Patient Re-evaluated prior to induction Oxygen Delivery Method: Circle System Utilized Preoxygenation: Pre-oxygenation with 100% oxygen Induction Type: IV induction Ventilation: Mask ventilation without difficulty Laryngoscope Size: Mac and 4 Grade View: Grade I Tube type: Oral Tube size: 7.5 mm Number of attempts: 1 Airway Equipment and Method: Stylet and Oral airway Placement Confirmation: ETT inserted through vocal cords under direct vision, positive ETCO2 and breath sounds checked- equal and bilateral Secured at: 22 cm Tube secured with: Tape Dental Injury: Teeth and Oropharynx as per pre-operative assessment

## 2021-07-14 ENCOUNTER — Encounter (HOSPITAL_COMMUNITY): Payer: Self-pay | Admitting: Cardiology

## 2021-07-19 NOTE — Telephone Encounter (Signed)
Pt reports some improvement, but still sore throat. States by the end of the day he can barely talk. Pt advised to f/u with PCP first. Pt says he is going to give it another day/two and if no improvement he will follow up w/ PCP. Pt aware I will send him a Mychart message to let us know how he is doing by next week. Patient verbalized understanding and agreeable to plan.

## 2021-07-21 MED ORDER — METOPROLOL SUCCINATE ER 50 MG PO TB24: 50.0000 mg | ORAL_TABLET | Freq: Every morning | ORAL | 3 refills | Status: DC

## 2021-07-21 NOTE — Addendum Note (Signed)
Addended by: Delorse Limber I on: 07/21/2021 07:11 AM   Modules accepted: Orders

## 2021-07-23 ENCOUNTER — Encounter (HOSPITAL_BASED_OUTPATIENT_CLINIC_OR_DEPARTMENT_OTHER): Payer: Self-pay | Admitting: Cardiovascular Disease

## 2021-07-23 NOTE — Procedures (Signed)
Patient Name: Dennis [name], Dennis [name] Date: 07/11/2021 Gender: Male D.O.B: 10-11-1966 Age (years): 55 Referring Provider: Park Liter Height (inches): 59 Interpreting Physician: Shelva Majestic MD, ABSM Weight (lbs): 235 RPSGT: Laren Everts BMI: 30 MRN: [id number] Neck Size: 17.00  CLINICAL INFORMATION Sleep Study Type: Split Night CPAP  Indication for sleep study: Snoring, Witnessed Apneas  Epworth Sleepiness Score: 9  SLEEP STUDY TECHNIQUE As per the AASM Manual for the Scoring of Sleep and Associated Events v2.3 (April 2016) with a hypopnea requiring 4% desaturations.  The channels recorded and monitored were frontal, central and occipital EEG, electrooculogram (EOG), submentalis EMG (chin), nasal and oral airflow, thoracic and abdominal wall motion, anterior tibialis EMG, snore microphone, electrocardiogram, and pulse oximetry. Continuous positive airway pressure (CPAP) was initiated when the patient met split night criteria and was titrated according to treat sleep-disordered breathing.  MEDICATIONS amoxicillin-clavulanate (AUGMENTIN) 875-125 MG tablet apixaban (ELIQUIS) 5 MG TABS tablet cetirizine (ZYRTEC) 10 MG tablet diltiazem (CARDIZEM) 30 MG tablet fluticasone (FLONASE) 50 MCG/ACT nasal spray metoprolol succinate (TOPROL-XL) 50 MG 24 hr tablet metoprolol tartrate (LOPRESSOR) 25 MG tablet PROAIR HFA 108 (90 Base) MCG/ACT inhaler   Medications self-administered by patient taken the night of the study : N/A  RESPIRATORY PARAMETERS Diagnostic Total AHI (/hr): 46.3 RDI (/hr): 49.1 OA Index (/hr): - CA Index (/hr): 0.9 REM AHI (/hr): 74.1 NREM AHI (/hr): 42.0 Supine AHI (/hr): 46.3 Non-supine AHI (/hr): 0 Min O2 Sat (%): 76.0 Mean O2 (%): 91.0 Time below 88% (min): 13.9   Titration Optimal Pressure (cm): 16 AHI at Optimal Pressure (/hr): 21.8 Min O2 at Optimal Pressure (%): 92.0 Supine % at Optimal (%): 100 Sleep % at Optimal (%): 90   SLEEP  ARCHITECTURE The recording time for the entire night was 424.2 minutes.  During a baseline period of 152.7 minutes, the patient slept for 127.0 minutes in REM and nonREM, yielding a sleep efficiency of 83.2%%. Sleep onset after lights out was 20.0 minutes with a REM latency of 50.5 minutes. The patient spent 5.9%% of the night in stage N1 sleep, 80.7%% in stage N2 sleep, 0.0%% in stage N3 and 13.4% in REM.  During the titration period of 267.3 minutes, the patient slept for 191.7 minutes in REM and nonREM, yielding a sleep efficiency of 71.7%%. Sleep onset after CPAP initiation was 7.6 minutes with a REM latency of 50.5 minutes. The patient spent 20.7%% of the night in stage N1 sleep, 48.8%% in stage N2 sleep, 0.0%% in stage N3 and 30.5% in REM.  CARDIAC DATA The 2 lead EKG demonstrated atrial fibrillation. The mean heart rate was 100.0 beats per minute. Other EKG findings include: possible Ventricular Tachycardia.  LEG MOVEMENT DATA The total Periodic Limb Movements of Sleep (PLMS) were 0. The PLMS index was 0.0 .  IMPRESSIONS - Severe obstructive sleep apnea occurred during the diagnostic portion of the study (AHI  46.3/h; RDI 49.1/h); events were very severe during REM sleep (AHI 67.1/h). CPAP was initiated at  5 cm and was titrated to 16 cm, AHI 21.8 with central events throughout CPAP titration.  - Mild central sleep apnea occurred during the diagnostic portion of the study (CAI 0.9/h) which increased during CPAP (CAI 10.6/h). - Significant oxygen desaturation to a nadir of 76% during the diagnostic portion of the study. Time spent < 88% was 13.9 minutes.  - The patient snored with moderate snoring volume during the diagnostic portion of the study. - EKG findings include atrial fibrillation with a  19 beat episode of WCT ? VT vs aberrancy. - Clinically significant periodic limb movements did not occur during sleep.  DIAGNOSIS - Obstructive Sleep Apnea (G47.33) - Central Sleep  Apnea  RECOMMENDATIONS - Recommend an in-lab BiPAP titration study and potential need for ASV titration due to central events with CPAP.  - Recommend a 2d echo-doppler study for re-evaluation of LV function since 2019; cardiology f/u with monitor with nonsustained WCT. - Effort should be made to optimize nasal and oropahryngeal patency.  - Avoid alcohol, sedatives and other CNS depressants that may worsen sleep apnea and disrupt normal sleep architecture. - Sleep hygiene should be reviewed to assess factors that may improve sleep quality. - Weight management and regular exercise should be initiated or continued.   [Electronically signed] 07/23/2021 11:19 AM  Shelva Majestic MD, [facility], ABSM Diplomate, American Board of Sleep Medicine   NPI: [id number]  [place] PH: [phone]   FX: [phone] [place]

## 2021-07-24 ENCOUNTER — Other Ambulatory Visit: Payer: Self-pay | Admitting: Cardiovascular Disease

## 2021-07-24 DIAGNOSIS — G4731 Primary central sleep apnea: Secondary | ICD-10-CM

## 2021-07-24 DIAGNOSIS — I4819 Other persistent atrial fibrillation: Secondary | ICD-10-CM

## 2021-07-24 DIAGNOSIS — G4733 Obstructive sleep apnea (adult) (pediatric): Secondary | ICD-10-CM

## 2021-07-26 ENCOUNTER — Telehealth: Payer: Self-pay | Admitting: *Deleted

## 2021-07-26 NOTE — Telephone Encounter (Signed)
-----   Message from Lennette Bihari, MD sent at 07/23/2021 11:25 AM EDT ----- Burna Mortimer, please notify pt and set up for BiPAP with possible ASV titration study

## 2021-07-26 NOTE — Telephone Encounter (Signed)
Left message to return a call to discuss sleep study results and recommendations. 

## 2021-08-03 DIAGNOSIS — Z125 Encounter for screening for malignant neoplasm of prostate: Secondary | ICD-10-CM | POA: Diagnosis not present

## 2021-08-03 DIAGNOSIS — Z6829 Body mass index (BMI) 29.0-29.9, adult: Secondary | ICD-10-CM | POA: Diagnosis not present

## 2021-08-03 DIAGNOSIS — Z0000 Encounter for general adult medical examination without abnormal findings: Secondary | ICD-10-CM | POA: Diagnosis not present

## 2021-08-03 DIAGNOSIS — Z13220 Encounter for screening for lipoid disorders: Secondary | ICD-10-CM | POA: Diagnosis not present

## 2021-08-03 DIAGNOSIS — Z23 Encounter for immunization: Secondary | ICD-10-CM | POA: Diagnosis not present

## 2021-08-08 ENCOUNTER — Other Ambulatory Visit: Payer: Self-pay | Admitting: Cardiovascular Disease

## 2021-08-08 ENCOUNTER — Ambulatory Visit: Payer: BC Managed Care – PPO | Admitting: Cardiology

## 2021-08-08 DIAGNOSIS — G4731 Primary central sleep apnea: Secondary | ICD-10-CM

## 2021-08-08 DIAGNOSIS — G4733 Obstructive sleep apnea (adult) (pediatric): Secondary | ICD-10-CM

## 2021-08-08 NOTE — Telephone Encounter (Signed)
Correction to previous note. Order has not been sent to [facility]. Order has been placed for BIPAP titration study.

## 2021-08-08 NOTE — Telephone Encounter (Signed)
Patient notified of sleep study results and recommendations. He agrees to proceed with BIPAP therapy. Patient had no questions or concerns when asked. BIPAP order has been sent to [facility].

## 2021-08-10 ENCOUNTER — Other Ambulatory Visit: Payer: Self-pay

## 2021-08-10 ENCOUNTER — Encounter (HOSPITAL_COMMUNITY): Payer: Self-pay | Admitting: Physician Assistant

## 2021-08-10 ENCOUNTER — Ambulatory Visit (HOSPITAL_COMMUNITY)
Admission: RE | Admit: 2021-08-10 | Discharge: 2021-08-10 | Disposition: A | Discharge status: Home / Self Care | Payer: BC Managed Care – PPO | Source: Ambulatory Visit | Attending: Physician Assistant | Admitting: Physician Assistant

## 2021-08-10 VITALS — BP 118/88 | HR 73 | Ht 74.0 in | Wt 235.2 lb

## 2021-08-10 DIAGNOSIS — I4819 Other persistent atrial fibrillation: Secondary | ICD-10-CM | POA: Insufficient documentation

## 2021-08-10 DIAGNOSIS — Z8249 Family history of ischemic heart disease and other diseases of the circulatory system: Secondary | ICD-10-CM | POA: Insufficient documentation

## 2021-08-10 DIAGNOSIS — Z6830 Body mass index (BMI) 30.0-30.9, adult: Secondary | ICD-10-CM | POA: Insufficient documentation

## 2021-08-10 DIAGNOSIS — Z79899 Other long term (current) drug therapy: Secondary | ICD-10-CM | POA: Insufficient documentation

## 2021-08-10 DIAGNOSIS — Z7182 Exercise counseling: Secondary | ICD-10-CM | POA: Insufficient documentation

## 2021-08-10 DIAGNOSIS — Z87891 Personal history of nicotine dependence: Secondary | ICD-10-CM | POA: Insufficient documentation

## 2021-08-10 DIAGNOSIS — E669 Obesity, unspecified: Secondary | ICD-10-CM | POA: Insufficient documentation

## 2021-08-10 DIAGNOSIS — Z7901 Long term (current) use of anticoagulants: Secondary | ICD-10-CM | POA: Diagnosis not present

## 2021-08-10 DIAGNOSIS — Z09 Encounter for follow-up examination after completed treatment for conditions other than malignant neoplasm: Secondary | ICD-10-CM | POA: Diagnosis not present

## 2021-08-10 MED ORDER — APIXABAN 5 MG PO TABS: 5.0000 mg | ORAL_TABLET | Freq: Two times a day (BID) | ORAL | 0 refills | Status: DC

## 2021-08-10 NOTE — Progress Notes (Signed)
Primary Care Physician: Noni Saupe, MD Primary Cardiologist: Dr Bing Matter Primary Electrophysiologist: Dr Elberta Fortis Referring Physician: Dr Santiago Bumpers is a 55 y.o. male with a history of SVT and atrial fibrillation who presents for follow up in the [facility]. Patient is on Eliquis for a CHADS2VASC score of 0. Patient underwent DCCV on 06/19/21 which was unsuccessful. He underwent afib ablation with Dr Elberta Fortis on 07/03/21. Patient reports that since then he has not had any heart racing or palpitations. His smart watch has shown only SR. He denies CP or groin issues. He did have some swallowing pain but this is resolving.   Today, he denies symptoms of palpitations, chest pain, shortness of breath, orthopnea, PND, lower extremity edema, dizziness, presyncope, syncope, snoring, daytime somnolence, bleeding, or neurologic sequela. The patient is tolerating medications without difficulties and is otherwise without complaint today.    Atrial Fibrillation Risk Factors:  he does have symptoms or diagnosis of sleep apnea. he is scheduled for BiPap titration.  he does not have a history of rheumatic fever. he does not have a history of alcohol use.  he has a BMI of Body mass index is 30.2 kg/m.Marland Kitchen Filed Weights   08/10/21 1519  Weight: 106.7 kg    Family History  Problem Relation Age of Onset   Heart disease Father    Lung cancer Paternal Grandfather      Atrial Fibrillation Management history:  Previous antiarrhythmic drugs: none Previous cardioversions: 06/19/21 Previous ablations: 07/13/21 CHADS2VASC score: 0 Anticoagulation history: Eliquis   Past Medical History:  Diagnosis Date   Asthma    History of kidney stones    Renal disorder    kidney stones   Past Surgical History:  Procedure Laterality Date   ATRIAL FIBRILLATION ABLATION N/A 07/13/2021   Procedure: ATRIAL FIBRILLATION ABLATION;  Surgeon: Regan Lemming,  MD;  Location: [facility] INVASIVE CV LAB;  Service: Cardiovascular;  Laterality: N/A;   CARDIOVERSION N/A 06/19/2021   Procedure: CARDIOVERSION;  Surgeon: [name], MD;  Location: [facility] ENDOSCOPY;  Service: Cardiovascular;  Laterality: N/A;   EXTRACORPOREAL SHOCK WAVE LITHOTRIPSY Left 03/28/2017   Procedure: LEFT EXTRACORPOREAL SHOCK WAVE LITHOTRIPSY (ESWL);  Surgeon: Marcine Matar, MD;  Location: [facility] ORS;  Service: Urology;  Laterality: Left;   LITHOTRIPSY     STENT PLACE LEFT URETER ([facility] HX) Bilateral     Current Outpatient Medications  Medication Sig Dispense Refill   cetirizine (ZYRTEC) 10 MG tablet Take 1 tablet (10 mg total) by mouth daily. 30 tablet 11   diltiazem (CARDIZEM) 30 MG tablet Take 30 mg by mouth every 6 (six) hours as needed (heart rate>100.).     fluticasone (FLONASE) 50 MCG/ACT nasal spray Place 2 sprays into both nostrils daily. 16 g 6   metoprolol succinate (TOPROL-XL) 50 MG 24 hr tablet Take 1 tablet (50 mg total) by mouth in the morning. Take with or immediately following a meal. 90 tablet 3   pravastatin (PRAVACHOL) 20 MG tablet Take 20 mg by mouth daily.     PROAIR HFA 108 (90 Base) MCG/ACT inhaler Inhale 2 puffs into the lungs every 6 (six) hours as needed for wheezing or shortness of breath.  5   apixaban (ELIQUIS) 5 MG TABS tablet Take 1 tablet (5 mg total) by mouth 2 (two) times daily. 60 tablet 0   No current facility-administered medications for this encounter.    No Known Allergies  Social History   Socioeconomic  History   Marital status: Married    Spouse name: Not on file   Number of children: Not on file   Years of education: Not on file   Highest education level: Not on file  Occupational History   Not on file  Tobacco Use   Smoking status: Former    Types: Cigars   Smokeless tobacco: Never   Tobacco comments:    Haven't smoke or drink since 04/24/2021  Vaping Use   Vaping Use: Never used  Substance and Sexual Activity   Alcohol use: No     Comment: socially   Drug use: No   Sexual activity: Yes  Other Topics Concern   Not on file  Social History Narrative   Not on file   Social Determinants of Health   Financial Resource Strain: Not on file  Food Insecurity: Not on file  Transportation Needs: Not on file  Physical Activity: Not on file  Stress: Not on file  Social Connections: Not on file  Intimate Partner Violence: Not on file     ROS- All systems are reviewed and negative except as per the HPI above.  Physical Exam: Vitals:   08/10/21 1519  BP: 118/88  Pulse: 73  Weight: 106.7 kg  Height: 6\' 2"  (1.88 m)    GEN- The patient is a well appearing obese male, alert and oriented x 3 today.   Head- normocephalic, atraumatic Eyes-  Sclera clear, conjunctiva pink Ears- hearing intact Oropharynx- clear Neck- supple  Lungs- Clear to ausculation bilaterally, normal work of breathing Heart- Regular rate and rhythm, no murmurs, rubs or gallops  GI- soft, NT, ND, + BS Extremities- no clubbing, cyanosis, or edema MS- no significant deformity or atrophy Skin- no rash or lesion Psych- euthymic mood, full affect Neuro- strength and sensation are intact  Wt Readings from Last 3 Encounters:  08/10/21 106.7 kg  07/13/21 106.6 kg  07/11/21 106.6 kg    EKG today demonstrates  Vent. rate 73 BPM PR interval 146 ms QRS duration 82 ms QT/QTcB 374/412 ms   Epic records are reviewed at length today  CHA2DS2-VASc Score = 0  The patient's score is based upon: CHF History: 0 HTN History: 0 Diabetes History: 0 Stroke History: 0 Vascular Disease History: 0 Age Score: 0 Gender Score: 0      ASSESSMENT AND PLAN: 1. Persistent Atrial Fibrillation (ICD10:  I48.19) The patient's CHA2DS2-VASc score is 0, indicating a 0.2% annual risk of stroke.   S/p afib ablation on 07/13/21 Patient appears to be maintaining SR. Continue Eliquis 5 mg BID with no missed doses for 3 months post ablation. Continue Toprol 50 mg  daily  2. Obesity Body mass index is 30.2 kg/m. Lifestyle modification was discussed at length including regular exercise and weight reduction.  3. Obstructive sleep apnea The importance of adequate treatment of sleep apnea was discussed today in order to improve our ability to maintain sinus rhythm long term. Scheduled for BiPap titration.    Follow up with Dr 07/15/21 and Dr Bing Matter as scheduled.    Elberta Fortis PA-C Af[facility] [address] [place], [place] [id number] 08/10/2021 4:11 PM

## 2021-08-27 ENCOUNTER — Ambulatory Visit (HOSPITAL_BASED_OUTPATIENT_CLINIC_OR_DEPARTMENT_OTHER): Payer: BC Managed Care – PPO | Attending: Cardiovascular Disease | Admitting: Cardiovascular Disease

## 2021-08-27 ENCOUNTER — Other Ambulatory Visit: Payer: Self-pay

## 2021-08-27 DIAGNOSIS — G4733 Obstructive sleep apnea (adult) (pediatric): Secondary | ICD-10-CM | POA: Diagnosis not present

## 2021-08-27 DIAGNOSIS — G4731 Primary central sleep apnea: Secondary | ICD-10-CM

## 2021-08-27 DIAGNOSIS — I4819 Other persistent atrial fibrillation: Secondary | ICD-10-CM

## 2021-09-07 ENCOUNTER — Ambulatory Visit: Payer: BC Managed Care – PPO | Admitting: Cardiology

## 2021-09-11 ENCOUNTER — Encounter (HOSPITAL_BASED_OUTPATIENT_CLINIC_OR_DEPARTMENT_OTHER): Payer: Self-pay | Admitting: Cardiovascular Disease

## 2021-09-11 NOTE — Procedures (Signed)
Patient Name: Ramelo, Cruzhernandez Date: 08/27/2021 Gender: Male D.O.B: 16-Aug-1966 Age (years): 55 Referring Provider: Georgeanna Lea Height (inches): 74 Interpreting Physician: Nicki Guadalajara MD, ABSM Weight (lbs): 233 RPSGT: Armen Pickup BMI: 30 MRN: [id number] Neck Size: 17.00  CLINICAL INFORMATION The patient is referred for a BiPAP titration to treat sleep apnea.  Date of Split Night:  07/11/2021:  AHI 46.3/h, RDI 49.1/h; REM AHI 74.1/h: O2 nadir 76%                                   Titration: AHI 27.9 at 16 cm; central events throughout the study.  SLEEP STUDY TECHNIQUE As per the AASM Manual for the Scoring of Sleep and Associated Events v2.3 (April 2016) with a hypopnea requiring 4% desaturations.  The channels recorded and monitored were frontal, central and occipital EEG, electrooculogram (EOG), submentalis EMG (chin), nasal and oral airflow, thoracic and abdominal wall motion, anterior tibialis EMG, snore microphone, electrocardiogram, and pulse oximetry. Bilevel positive airway pressure (BPAP) was initiated at the beginning of the study and titrated to treat sleep-disordered breathing.  MEDICATIONS apixaban (ELIQUIS) 5 MG TABS tablet cetirizine (ZYRTEC) 10 MG tablet diltiazem (CARDIZEM) 30 MG tablet fluticasone (FLONASE) 50 MCG/ACT nasal spray metoprolol succinate (TOPROL-XL) 50 MG 24 hr tablet pravastatin (PRAVACHOL) 20 MG tablet PROAIR HFA 108 (90 Base) MCG/ACT inhaler  Medications self-administered by patient taken the night of the study : N/A  RESPIRATORY PARAMETERS Optimal IPAP Pressure (cm): 15 AHI at Optimal Pressure (/hr) 1.2 Optimal EPAP Pressure (cm): 11   Overall Minimal O2 (%): 87.0 Minimal O2 at Optimal Pressure (%): 90.0  SLEEP ARCHITECTURE Start Time: 10:01:54 PM Stop Time: 4:12:54 AM Total Time (min): 371 Total Sleep Time (min): 334.8 Sleep Latency (min): 6.7 Sleep Efficiency (%): 90.2% REM Latency (min): 41.0 WASO  (min): 29.5 Stage N1 (%): 6.0% Stage N2 (%): 65.5% Stage N3 (%): 0.1% Stage R (%): 28.4 Supine (%): 72.22 Arousal Index (/hr): 13.6   CARDIAC DATA The 2 lead EKG demonstrated sinus rhythm. The mean heart rate was 66.0 beats per minute. Other EKG findings include: None.  LEG MOVEMENT DATA The total Periodic Limb Movements of Sleep (PLMS) were 0. The PLMS index was 0.0. A PLMS index of <15 is considered normal in adults.  IMPRESSIONS - BiPAP was initiated at 8/4 and was titrated to 15/11 cm of water. AHI at 14/10 was 0 with O2 nadir 88% and at 15/11 was 1.8/h with 1 central event and O2 nadir 90%. - Central sleep apnea was not noted during this titration (CAI = 0.2/h). - Mild oxygen desaturations to a nadir of 87% at  11/7 cm of water. - Mild intermittent snoring resolved at 15/11 cm of water. - No cardiac abnormalities were observed during this study. - Clinically significant periodic limb movements were not noted during this study. Arousals associated with PLMs were rare.  DIAGNOSIS - Obstructive Sleep Apnea (G47.33)  RECOMMENDATIONS - Recommend an initial trial BiPAP therapy on 15/11 cm H2O with heated humidification.  A medium size [employer] Full Face Mask Simplus mask was used for the titration. - Effort should be made to optimize nasal and oropharyngeal patency. - Avoid alcohol, sedatives and other CNS depressants that may worsen sleep apnea and disrupt normal sleep architecture. - Sleep hygiene should be reviewed to assess factors that may improve sleep quality. - Weight management and regular exercise should be initiated or continued. -  Recommend a download in 30 days and sleep clinic evaluation after 4 weeks of therapy.  [Electronically signed] 09/11/2021 08:35 AM  Nicki Guadalajara MD, [facility], ABSM Diplomate, American Board of Sleep Medicine   NPI: [id number] [facility] PH: [phone]   FX: [phone] ACCREDITED BY THE AMERICAN ACADEMY OF  SLEEP MEDICINE

## 2021-09-13 ENCOUNTER — Telehealth: Payer: Self-pay | Admitting: *Deleted

## 2021-09-13 NOTE — Telephone Encounter (Signed)
BIPAP order had to be changed from [facility] to [facility]. [facility] does not have any. They only have the [employer].

## 2021-09-26 ENCOUNTER — Ambulatory Visit (INDEPENDENT_AMBULATORY_CARE_PROVIDER_SITE_OTHER): Payer: BC Managed Care – PPO | Admitting: Cardiology

## 2021-09-26 ENCOUNTER — Other Ambulatory Visit: Payer: Self-pay

## 2021-09-26 ENCOUNTER — Encounter: Payer: Self-pay | Admitting: Cardiology

## 2021-09-26 VITALS — BP 110/72 | HR 67 | Ht 72.0 in | Wt 234.2 lb

## 2021-09-26 DIAGNOSIS — G4733 Obstructive sleep apnea (adult) (pediatric): Secondary | ICD-10-CM

## 2021-09-26 DIAGNOSIS — Z8679 Personal history of other diseases of the circulatory system: Secondary | ICD-10-CM | POA: Insufficient documentation

## 2021-09-26 DIAGNOSIS — R002 Palpitations: Secondary | ICD-10-CM

## 2021-09-26 DIAGNOSIS — R079 Chest pain, unspecified: Secondary | ICD-10-CM

## 2021-09-26 DIAGNOSIS — R0602 Shortness of breath: Secondary | ICD-10-CM

## 2021-09-26 DIAGNOSIS — I480 Paroxysmal atrial fibrillation: Secondary | ICD-10-CM | POA: Diagnosis not present

## 2021-09-26 DIAGNOSIS — Z9889 Other specified postprocedural states: Secondary | ICD-10-CM

## 2021-09-26 HISTORY — DX: Personal history of other diseases of the circulatory system: Z98.890

## 2021-09-26 NOTE — Patient Instructions (Signed)

## 2021-09-26 NOTE — Progress Notes (Signed)
Cardiology Office Note:    Date:  09/26/2021   ID:  Dennis [name], DOB [birth year], MRN [id number]  PCP:  Angelina Sheriff, MD  Cardiologist:  Jenne Campus, MD    Referring MD: Angelina Sheriff, MD   Chief Complaint  Patient presents with   Follow-up  I am doing great  History of Present Illness:    Dennis [name] is a 55 y.o. male with past medical history significant for paroxysmal atrial fibrillation, SVT, in August 2022 he did have cardioversion however was unsuccessful, after that in July 03, 2021 he did have atrial fibrillation ablation.  Since that time he is doing great denies have any palpitations seems to maintain sinus rhythm.  He does have apple watch and periodically checked his rhythm it is always normal.  He also has history of obstructive sleep apnea which being recently recognized.  In the matter-of-fact today he received his equipment to start using his BiPAP mask. Overall he is very happy the way he feels.  Past Medical History:  Diagnosis Date   Asthma    History of kidney stones    Renal disorder    kidney stones    Past Surgical History:  Procedure Laterality Date   ATRIAL FIBRILLATION ABLATION N/A 07/13/2021   Procedure: ATRIAL FIBRILLATION ABLATION;  Surgeon: Constance Haw, MD;  Location: [place] CV LAB;  Service: Cardiovascular;  Laterality: N/A;   CARDIOVERSION N/A 06/19/2021   Procedure: CARDIOVERSION;  Surgeon: Pixie Casino, MD;  Location: [place];  Service: Cardiovascular;  Laterality: N/A;   EXTRACORPOREAL SHOCK WAVE LITHOTRIPSY Left 03/28/2017   Procedure: LEFT EXTRACORPOREAL SHOCK WAVE LITHOTRIPSY (ESWL);  Surgeon: Franchot Gallo, MD;  Location: [facility] ORS;  Service: Urology;  Laterality: Left;   LITHOTRIPSY     STENT PLACE LEFT URETER ([facility] HX) Bilateral     Current Medications: Current Meds  Medication Sig   apixaban (ELIQUIS) 5 MG TABS tablet Take 1 tablet (5 mg total) by mouth 2 (two) times daily.    cetirizine (ZYRTEC) 10 MG tablet Take 1 tablet (10 mg total) by mouth daily.   diltiazem (CARDIZEM) 30 MG tablet Take 30 mg by mouth every 6 (six) hours as needed (heart rate>100.).   fluticasone (FLONASE) 50 MCG/ACT nasal spray Place 2 sprays into both nostrils daily.   metoprolol succinate (TOPROL-XL) 50 MG 24 hr tablet Take 1 tablet (50 mg total) by mouth in the morning. Take with or immediately following a meal.   pravastatin (PRAVACHOL) 20 MG tablet Take 20 mg by mouth daily.   PROAIR HFA 108 (90 Base) MCG/ACT inhaler Inhale 2 puffs into the lungs every 6 (six) hours as needed for wheezing or shortness of breath.     Allergies:   Patient has no known allergies.   Social History   Socioeconomic History   Marital status: Married    Spouse name: Not on file   Number of children: Not on file   Years of education: Not on file   Highest education level: Not on file  Occupational History   Not on file  Tobacco Use   Smoking status: Former    Types: Cigars   Smokeless tobacco: Never   Tobacco comments:    Haven't smoke or drink since 04/24/2021  Vaping Use   Vaping Use: Never used  Substance and Sexual Activity   Alcohol use: No    Comment: socially   Drug use: No   Sexual activity: Yes  Other Topics  Concern   Not on file  Social History Narrative   Not on file   Social Determinants of Health   Financial Resource Strain: Not on file  Food Insecurity: Not on file  Transportation Needs: Not on file  Physical Activity: Not on file  Stress: Not on file  Social Connections: Not on file     Family History: The patient's family history includes Heart disease in his father; Lung cancer in his paternal grandfather. ROS:   Please see the history of present illness.    All 14 point review of systems negative except as described per history of present illness  EKGs/Labs/Other Studies Reviewed:      Recent Labs: 07/04/2021: BUN 13; Creatinine, Ser 1.16; Hemoglobin 15.4;  Platelets 222; Potassium 4.4; Sodium 141  Recent Lipid Panel No results found for: CHOL, TRIG, HDL, CHOLHDL, VLDL, LDLCALC, LDLDIRECT  Physical Exam:    VS:  BP 110/72 (BP Location: Left Arm, Patient Position: Sitting)   Pulse 67   Ht 6' (1.829 m)   Wt 234 lb 3.2 oz (106.2 kg)   SpO2 97%   BMI 31.76 kg/m     Wt Readings from Last 3 Encounters:  09/26/21 234 lb 3.2 oz (106.2 kg)  08/27/21 233 lb (105.7 kg)  08/10/21 235 lb 3.2 oz (106.7 kg)     GEN:  Well nourished, well developed in no acute distress HEENT: Normal NECK: No JVD; No carotid bruits LYMPHATICS: No lymphadenopathy CARDIAC: RRR, no murmurs, no rubs, no gallops RESPIRATORY:  Clear to auscultation without rales, wheezing or rhonchi  ABDOMEN: Soft, non-tender, non-distended MUSCULOSKELETAL:  No edema; No deformity  SKIN: Warm and dry LOWER EXTREMITIES: no swelling NEUROLOGIC:  Alert and oriented x 3 PSYCHIATRIC:  Normal affect   ASSESSMENT:    1. Paroxysmal atrial fibrillation (HCC)   2. S/P ablation of atrial fibrillation   3. Obstructive sleep apnea   4. Chest pain in adult   5. SOB (shortness of breath)   6. Palpitations    PLAN:    In order of problems listed above:  Paroxysmal atrial fibrillation status post atrial fibrillation ablation done in September.  No recurrences of arrhythmia.  Since his CHADS2 Vascor equals 0 in 3 months we will be able to discontinue his anticoagulation.  He does have appoint with our EP team in January we will do discontinuation at that time.  Also in the future most likely will discontinue his metoprolol.  He does have history of supraventricular tachycardia there may be some role to use this medication for that condition but we will see how things will proceed. Obstructive sleep apnea which is a new discovery.  He just received his BiPAP mask today.  He is looking forward to use it since when he tried BiPAP mask in the laboratory he slept great. Dyslipidemia that is being  followed by internal medicine team.  He is on pravastatin.  I did review K PN which show me data from 08/03/2021 with HDL of 39 but I do not have recent LDL.  We will call primary care physician to get report of it.   Medication Adjustments/Labs and Tests Ordered: Current medicines are reviewed at length with the patient today.  Concerns regarding medicines are outlined above.  No orders of the defined types were placed in this encounter.  Medication changes: No orders of the defined types were placed in this encounter.   Signed, Georgeanna Lea, MD, [facility] 09/26/2021 2:10 PM    [facility]

## 2021-10-27 DIAGNOSIS — G4733 Obstructive sleep apnea (adult) (pediatric): Secondary | ICD-10-CM | POA: Diagnosis not present

## 2021-10-30 ENCOUNTER — Other Ambulatory Visit: Payer: Self-pay

## 2021-10-30 ENCOUNTER — Encounter: Payer: Self-pay | Admitting: Cardiology

## 2021-10-30 ENCOUNTER — Ambulatory Visit: Payer: BC Managed Care – PPO | Admitting: Cardiology

## 2021-10-30 VITALS — BP 104/70 | HR 71 | Ht 72.0 in | Wt 239.6 lb

## 2021-10-30 DIAGNOSIS — R0602 Shortness of breath: Secondary | ICD-10-CM

## 2021-10-30 NOTE — Patient Instructions (Addendum)
Medication Instructions:  STOP eliquis.  STOP metoprolol XL.   *If you need a refill on your cardiac medications before your next appointment, please call your pharmacy*   Lab Work: None today  If you have labs (blood work) drawn today and your tests are completely normal, you will receive your results only by: MyChart Message (if you have MyChart) OR A paper copy in the mail If you have any lab test that is abnormal or we need to change your treatment, we will call you to review the results.   Testing/Procedures: None today    Follow-Up: At [facility], you and your health needs are our priority.  As part of our continuing mission to provide you with exceptional heart care, we have created designated Provider Care Teams.  These Care Teams include your primary Cardiologist (physician) and Advanced Practice Providers (APPs -  Physician Assistants and Nurse Practitioners) who all work together to provide you with the care you need, when you need it.  We recommend signing up for the patient portal called "MyChart".  Sign up information is provided on this After Visit Summary.  MyChart is used to connect with patients for Virtual Visits (Telemedicine).  Patients are able to view lab/test results, encounter notes, upcoming appointments, etc.  Non-urgent messages can be sent to your provider as well.   To learn more about what you can do with MyChart, go to [email or web address].    Your next appointment:   3 month(s)  The format for your next appointment:   In Person  Provider:   Loman Brooklyn MD  {

## 2021-10-30 NOTE — Progress Notes (Signed)
Electrophysiology Office Note   Date:  10/30/2021   ID:  Dennis [name], DOB June 06, 1966, MRN [id number]  PCP:  Angelina Sheriff, MD  Cardiologist: Agustin Cree Primary Electrophysiologist:  Anuradha Chabot Meredith Leeds, MD    Chief Complaint: Atrial fibrillation   History of Present Illness: Dennis [name] is a 56 y.o. male who is being seen today for the evaluation of atrial fibrillation at the request of [facility]. Presenting today for electrophysiology evaluation.  He has a history significant for SVT suppressed with beta-blockers.  He went to the hospital due to palpitations and was found to be in atrial fibrillation.  Had a cardioversion but did not convert to sinus rhythm.  He is now status post atrial fibrillation ablation 07/13/2021.  Today, denies symptoms of palpitations, chest pain, shortness of breath, orthopnea, PND, lower extremity edema, claudication, dizziness, presyncope, syncope, bleeding, or neurologic sequela. The patient is tolerating medications without difficulties.  He currently feels well.  He has no chest pain or shortness of breath.  He has noted no further episodes of atrial fibrillation.  He has had a few times where he felt like he was about to go into A. fib, but checked his watch and he remained in sinus rhythm.   Past Medical History:  Diagnosis Date   Asthma    History of kidney [name]    Renal disorder    kidney [name]   Past Surgical History:  Procedure Laterality Date   ATRIAL FIBRILLATION ABLATION N/A 07/13/2021   Procedure: ATRIAL FIBRILLATION ABLATION;  Surgeon: Constance Haw, MD;  Location: [facility];  Service: Cardiovascular;  Laterality: N/A;   CARDIOVERSION N/A 06/19/2021   Procedure: CARDIOVERSION;  Surgeon: Pixie Casino, MD;  Location: [facility];  Service: Cardiovascular;  Laterality: N/A;   EXTRACORPOREAL SHOCK WAVE LITHOTRIPSY Left 03/28/2017   Procedure: LEFT EXTRACORPOREAL SHOCK WAVE LITHOTRIPSY (ESWL);  Surgeon:  Franchot Gallo, MD;  Location: [facility];  Service: Urology;  Laterality: Left;   LITHOTRIPSY     STENT PLACE LEFT URETER ([facility] HX) Bilateral      Current Outpatient Medications  Medication Sig Dispense Refill   apixaban (ELIQUIS) 5 MG TABS tablet Take 1 tablet (5 mg total) by mouth 2 (two) times daily. 60 tablet 0   cetirizine (ZYRTEC) 10 MG tablet Take 1 tablet (10 mg total) by mouth daily. 30 tablet 11   diltiazem (CARDIZEM) 30 MG tablet Take 30 mg by mouth every 6 (six) hours as needed (heart rate>100.).     fluticasone (FLONASE) 50 MCG/ACT nasal spray Place 2 sprays into both nostrils daily. 16 g 6   metoprolol succinate (TOPROL-XL) 50 MG 24 hr tablet Take 1 tablet (50 mg total) by mouth in the morning. Take with or immediately following a meal. 90 tablet 3   pravastatin (PRAVACHOL) 20 MG tablet Take 20 mg by mouth daily.     PROAIR HFA 108 (90 Base) MCG/ACT inhaler Inhale 2 puffs into the lungs every 6 (six) hours as needed for wheezing or shortness of breath.  5   No current facility-administered medications for this visit.    Allergies:   Patient has no known allergies.   Social History:  The patient  reports that he has quit smoking. His smoking use included cigars. He has never used smokeless tobacco. He reports that he does not drink alcohol and does not use drugs.   Family History:  The patient's family history includes Heart disease in his father; Lung cancer  in his paternal grandfather.   ROS:  Please see the history of present illness.   Otherwise, review of systems is positive for none.   All other systems are reviewed and negative.   PHYSICAL EXAM: VS:  BP 104/70    Pulse 71    Ht 6' (1.829 m)    Wt 239 lb 9.6 oz (108.7 kg)    SpO2 96%    BMI 32.50 kg/m  , BMI Body mass index is 32.5 kg/m. GEN: Well nourished, well developed, in no acute distress  HEENT: normal  Neck: no JVD, carotid bruits, or masses Cardiac: RRR; no murmurs, rubs, or gallops,no edema   Respiratory:  clear to auscultation bilaterally, normal work of breathing GI: soft, nontender, nondistended, + BS MS: no deformity or atrophy  Skin: warm and dry Neuro:  Strength and sensation are intact Psych: euthymic mood, full affect  EKG:  EKG is ordered today. Personal review of the ekg ordered shows sinus rhythm  Recent Labs: 07/04/2021: BUN 13; Creatinine, Ser 1.16; Hemoglobin 15.4; Platelets 222; Potassium 4.4; Sodium 141    Lipid Panel  No results found for: CHOL, TRIG, HDL, CHOLHDL, VLDL, LDLCALC, LDLDIRECT   Wt Readings from Last 3 Encounters:  10/30/21 239 lb 9.6 oz (108.7 kg)  09/26/21 234 lb 3.2 oz (106.2 kg)  08/27/21 233 lb (105.7 kg)      Other studies Reviewed: Additional studies/ records that were reviewed today include: Stress echo 2019 Review of the above records today demonstrates:  - Stress ECG conclusions: There were no stress arrhythmias or    conduction abnormalities. The stress ECG was normal.  - Staged echo: There was no echocardiographic evidence for    stress-induced ischemia.    ASSESSMENT AND PLAN:  1.  Persistent atrial fibrillation: Currently on Eliquis 5 mg twice daily, Toprol-XL 50 mg daily.  CHA2DS2-VASc of 0.  Status post ablation 07/13/2021.  We Shawn Carattini stop Eliquis and Toprol-XL.  2.  Obstructive sleep apnea: CPAP compliance encouraged   Current medicines are reviewed at length with the patient today.   The patient does not have concerns regarding his medicines.  The following changes were made today: Stop Eliquis, Toprol-XL  Labs/ tests ordered today include:  Orders Placed This Encounter  Procedures   EKG 12-Lead     Disposition:   FU with Aja Bolander 3 months  Signed, Lukas Pelcher Meredith Leeds, MD  10/30/2021 3:48 PM     [address] [place] [address] [phone] (office) [phone] (fax)

## 2021-11-27 DIAGNOSIS — G4733 Obstructive sleep apnea (adult) (pediatric): Secondary | ICD-10-CM | POA: Diagnosis not present

## 2021-11-27 DIAGNOSIS — R972 Elevated prostate specific antigen [PSA]: Secondary | ICD-10-CM | POA: Diagnosis not present

## 2021-12-03 ENCOUNTER — Other Ambulatory Visit: Payer: Self-pay | Admitting: Family

## 2021-12-03 DIAGNOSIS — U071 COVID-19: Secondary | ICD-10-CM

## 2021-12-03 DIAGNOSIS — J029 Acute pharyngitis, unspecified: Secondary | ICD-10-CM

## 2021-12-25 DIAGNOSIS — G4733 Obstructive sleep apnea (adult) (pediatric): Secondary | ICD-10-CM | POA: Diagnosis not present

## 2022-01-29 ENCOUNTER — Encounter: Payer: Self-pay | Admitting: Cardiology

## 2022-01-29 ENCOUNTER — Ambulatory Visit: Payer: BC Managed Care – PPO | Admitting: Cardiology

## 2022-01-29 VITALS — BP 122/80 | HR 70 | Ht 74.0 in | Wt 248.8 lb

## 2022-01-29 DIAGNOSIS — I4819 Other persistent atrial fibrillation: Secondary | ICD-10-CM

## 2022-01-29 MED ORDER — PRAVASTATIN SODIUM 20 MG PO TABS: 20.0000 mg | ORAL_TABLET | Freq: Every day | ORAL | 3 refills | Status: DC

## 2022-01-29 NOTE — Progress Notes (Signed)
? ?Electrophysiology Office Note ? ? ?Date:  01/29/2022  ? ?ID:  Dennis [name], DOB 1966/09/19, MRN [id number] ? ?PCP:  Angelina Sheriff, MD  ?Cardiologist: Agustin Cree ?Primary Electrophysiologist:  Maksim Peregoy Meredith Leeds, MD   ? ?Chief Complaint: Atrial fibrillation ?  ?History of Present Illness: ?Dennis [name] is a 56 y.o. male who is being seen today for the evaluation of atrial fibrillation at the request of [facility]. Presenting today for electrophysiology evaluation. ? ?He has a history significant for SVT suppressed with beta-blockers.  He went to the hospital due to palpitations and was found to be in atrial fibrillation.  He had a cardioversion but did not convert to sinus rhythm.  He is now status post ablation for atrial fibrillation 07/13/2021. ? ?Today, denies symptoms of palpitations, chest pain, shortness of breath, orthopnea, PND, lower extremity edema, claudication, dizziness, presyncope, syncope, bleeding, or neurologic sequela. The patient is tolerating medications without difficulties.  Has been feeling well.  He has no chest pain or shortness of breath.  He is able to do all of his daily activities without restriction.  He is having no further episodes of atrial fibrillation and is happy with his control. ? ? ?Past Medical History:  ?Diagnosis Date  ? Asthma   ? History of kidney stones   ? Renal disorder   ? kidney stones  ? ?Past Surgical History:  ?Procedure Laterality Date  ? ATRIAL FIBRILLATION ABLATION N/A 07/13/2021  ? Procedure: ATRIAL FIBRILLATION ABLATION;  Surgeon: Constance Haw, MD;  Location: [facility];  Service: Cardiovascular;  Laterality: N/A;  ? CARDIOVERSION N/A 06/19/2021  ? Procedure: CARDIOVERSION;  Surgeon: Pixie Casino, MD;  Location: [facility];  Service: Cardiovascular;  Laterality: N/A;  ? EXTRACORPOREAL SHOCK WAVE LITHOTRIPSY Left 03/28/2017  ? Procedure: LEFT EXTRACORPOREAL SHOCK WAVE LITHOTRIPSY (ESWL);  Surgeon: Franchot Gallo, MD;   [facility];  Service: Urology;  Laterality: Left;  ? LITHOTRIPSY    ? STENT PLACE LEFT URETER ([facility] HX) Bilateral   ? ? ? ?Current Outpatient Medications  ?Medication Sig Dispense Refill  ? cetirizine (ZYRTEC) 10 MG tablet Take 1 tablet (10 mg total) by mouth daily. 30 tablet 11  ? diltiazem (CARDIZEM) 30 MG tablet Take 30 mg by mouth every 6 (six) hours as needed (heart rate>100.).    ? fluticasone (FLONASE) 50 MCG/ACT nasal spray Place 2 sprays into both nostrils daily. 16 g 6  ? PROAIR HFA 108 (90 Base) MCG/ACT inhaler Inhale 2 puffs into the lungs every 6 (six) hours as needed for wheezing or shortness of breath.  5  ? pravastatin (PRAVACHOL) 20 MG tablet Take 1 tablet (20 mg total) by mouth daily. 90 tablet 3  ? ?No current facility-administered medications for this visit.  ? ? ?Allergies:   Patient has no known allergies.  ? ?Social History:  The patient  reports that he has quit smoking. His smoking use included cigars. He has never used smokeless tobacco. He reports that he does not drink alcohol and does not use drugs.  ? ?Family History:  The patient's family history includes Heart disease in his father; Lung cancer in his paternal grandfather.  ? ?ROS:  Please see the history of present illness.   Otherwise, review of systems is positive for none.   All other systems are reviewed and negative.  ? ?PHYSICAL EXAM: ?VS:  BP 122/80   Pulse 70   Ht 6\' 2"  (1.88 m)   Wt 248 lb  12.8 oz (112.9 kg)   SpO2 95%   BMI 31.94 kg/m?  , BMI Body mass index is 31.94 kg/m?. ?GEN: Well nourished, well developed, in no acute distress  ?HEENT: normal  ?Neck: no JVD, carotid bruits, or masses ?Cardiac: RRR; no murmurs, rubs, or gallops,no edema  ?Respiratory:  clear to auscultation bilaterally, normal work of breathing ?GI: soft, nontender, nondistended, + BS ?MS: no deformity or atrophy  ?Skin: warm and dry ?Neuro:  Strength and sensation are intact ?Psych: euthymic mood, full affect ? ?EKG:  EKG is ordered  today. ?Personal review of the ekg ordered shows sinus rhythm ? ?Recent Labs: ?07/04/2021: BUN 13; Creatinine, Ser 1.16; Hemoglobin 15.4; Platelets 222; Potassium 4.4; Sodium 141  ? ? ?Lipid Panel  ?No results found for: CHOL, TRIG, HDL, CHOLHDL, VLDL, LDLCALC, LDLDIRECT ? ? ?Wt Readings from Last 3 Encounters:  ?01/29/22 248 lb 12.8 oz (112.9 kg)  ?10/30/21 239 lb 9.6 oz (108.7 kg)  ?09/26/21 234 lb 3.2 oz (106.2 kg)  ?  ? ? ?Other studies Reviewed: ?Additional studies/ records that were reviewed today include: Stress echo 2019 ?Review of the above records today demonstrates:  ?- Stress ECG conclusions: There were no stress arrhythmias or  ?  conduction abnormalities. The stress ECG was normal.  ?- Staged echo: There was no echocardiographic evidence for  ?  stress-induced ischemia.  ? ? ?ASSESSMENT AND PLAN: ? ?1.  Persistent atrial fibrillation: CHA2DS2-VASc of 0 and thus not anticoagulated.  Status post ablation 07/13/2021.  He is fortunately not had any further episodes of atrial fibrillation.  He is overall happy with his control.  No changes at this time. ? ?2.  Obstructive sleep apnea: CPAP compliance encouraged ? ?Current medicines are reviewed at length with the patient today.   ?The patient does not have concerns regarding his medicines.  The following changes were made today: None ? ?Labs/ tests ordered today include:  ?Orders Placed This Encounter  ?Procedures  ? EKG 12-Lead  ? ? ? ?Disposition:   FU with Kannan Proia 6 months ? ?Signed, ?Emmaclaire Switala Meredith Leeds, MD  ?01/29/2022 4:47 PM    ? ?[facility] ?[address] ?[address] ?[place] [address] ?[phone] (office) ?[phone] (fax) ? ?

## 2022-01-29 NOTE — Patient Instructions (Signed)
Medication Instructions:  ?Your physician recommends that you continue on your current medications as directed. Please refer to the Current Medication list given to you today. ? ?*If you need a refill on your cardiac medications before your next appointment, please call your pharmacy* ? ? ?Lab Work: ?None ordered. ? ?If you have labs (blood work) drawn today and your tests are completely normal, you will receive your results only by: ?MyChart Message (if you have MyChart) OR ?A paper copy in the mail ?If you have any lab test that is abnormal or we need to change your treatment, we will call you to review the results. ? ? ?Testing/Procedures: ?None ordered. ? ? ? ?Follow-Up: ?At [facility], you and your health needs are our priority.  As part of our continuing mission to provide you with exceptional heart care, we have created designated Provider Care Teams.  These Care Teams include your primary Cardiologist (physician) and Advanced Practice Providers (APPs -  Physician Assistants and Nurse Practitioners) who all work together to provide you with the care you need, when you need it. ? ?We recommend signing up for the patient portal called "MyChart".  Sign up information is provided on this After Visit Summary.  MyChart is used to connect with patients for Virtual Visits (Telemedicine).  Patients are able to view lab/test results, encounter notes, upcoming appointments, etc.  Non-urgent messages can be sent to your provider as well.   ?To learn more about what you can do with MyChart, go to [email or web address].   ? ?Your next appointment:   ?6 month(s) ? ?The format for your next appointment:   ?In Person ? ?Provider:   ?Will Camnitz, MD{ ? ? ?Important Information About Sugar ? ? ? ? ?  ?

## 2022-03-18 ENCOUNTER — Other Ambulatory Visit: Payer: Self-pay | Admitting: Family

## 2022-03-18 DIAGNOSIS — U071 COVID-19: Secondary | ICD-10-CM

## 2022-03-18 DIAGNOSIS — J029 Acute pharyngitis, unspecified: Secondary | ICD-10-CM

## 2022-04-02 ENCOUNTER — Ambulatory Visit: Payer: BC Managed Care – PPO | Admitting: Cardiology

## 2022-04-17 DIAGNOSIS — G4733 Obstructive sleep apnea (adult) (pediatric): Secondary | ICD-10-CM | POA: Diagnosis not present

## 2022-06-13 IMAGING — CT CT ANGIO CHEST
2 of 6 series · 17 of 46 positions shown · IV contrast (Omni 300)
Comparison: 07/06/2021

CLINICAL DATA: Atrial fibrillation, concern for anomalous pulmonary
venous drainage by cardiac CTA.

EXAM:
CT ANGIOGRAPHY CHEST WITH CONTRAST
TECHNIQUE: Multidetector CT imaging of the chest was performed using the
standard protocol during bolus administration of intravenous
contrast. Multiplanar CT image reconstructions and MIPs were
obtained to evaluate the vascular anatomy.
CONTRAST:  80mL OMNIPAQUE IOHEXOL 350 MG/ML SOLN

[Series 5: thoracic cta 2mm · axial · 0.79mm/px · z∈[+1203,+1475]mm · 14 of 158 slices shown]
[im 11/158  lung]
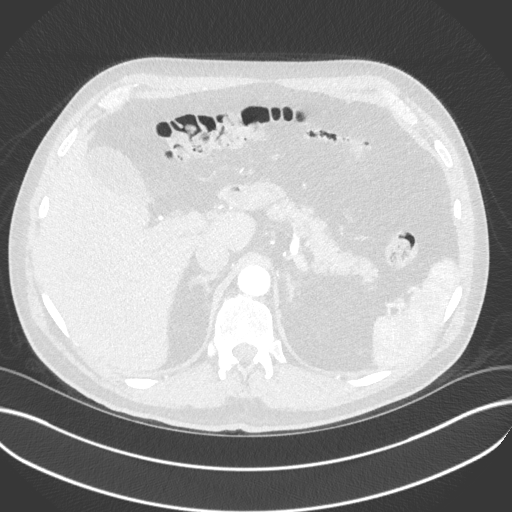
[im 21/158  soft-tissue]
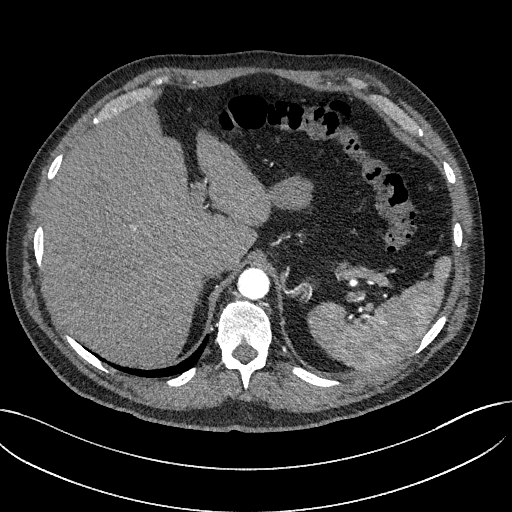
[im 32/158  lung]
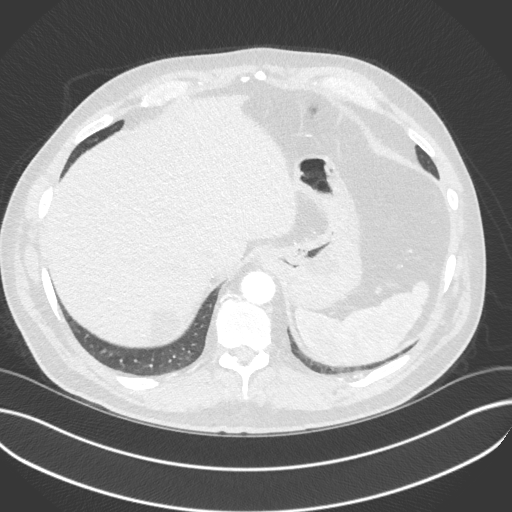
[im 42/158  soft-tissue]
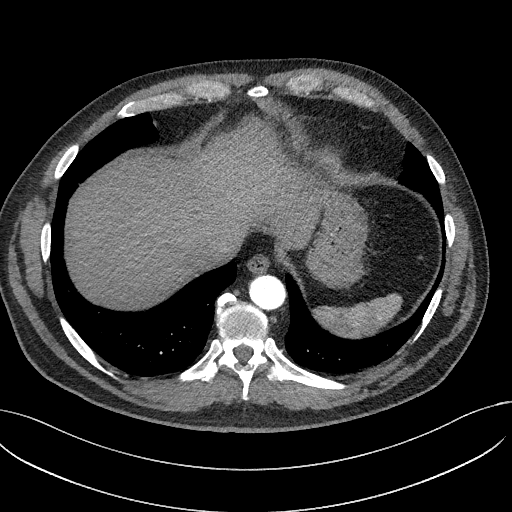
[im 53/158  lung]
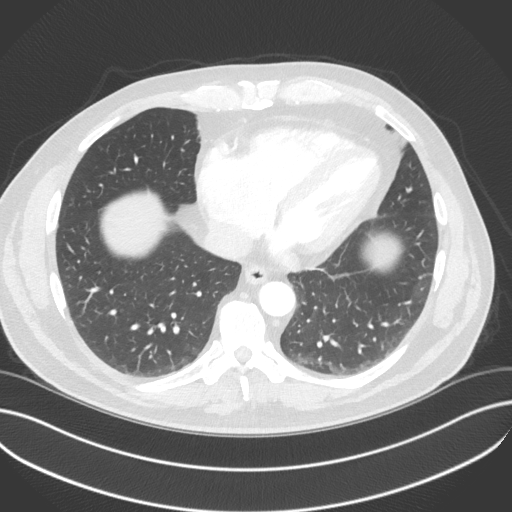
[im 63/158  soft-tissue]
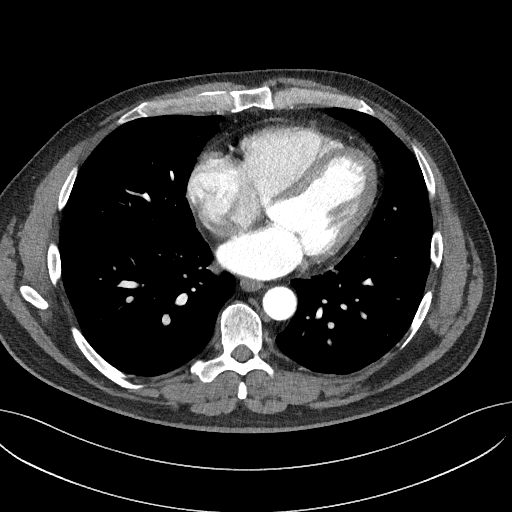
[im 74/158  lung]
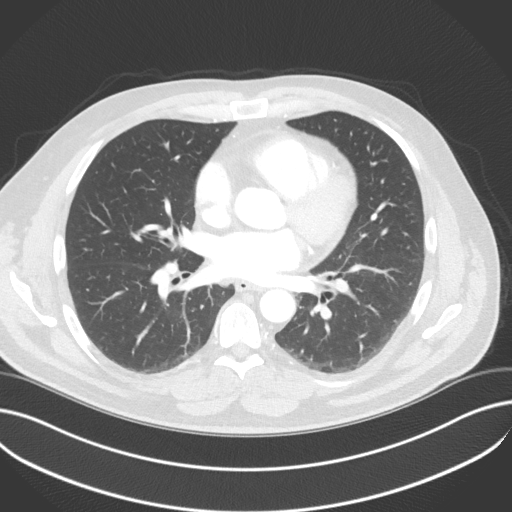
[im 84/158  soft-tissue]
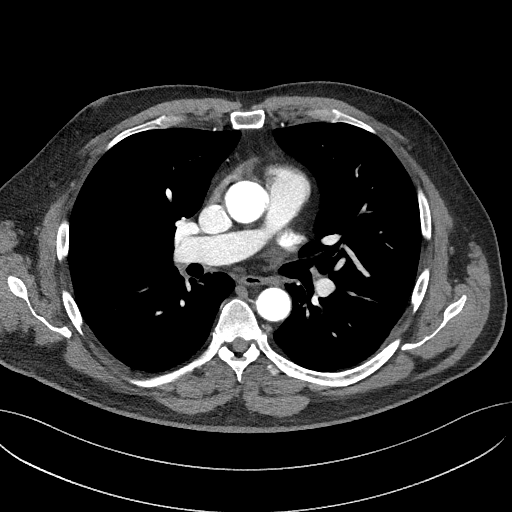
[im 95/158  lung]
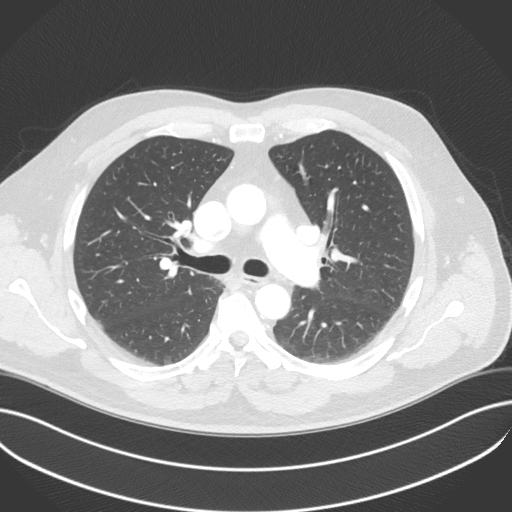
[im 105/158  soft-tissue]
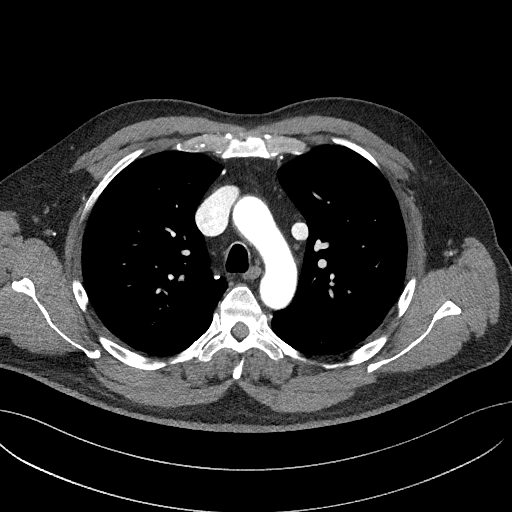
[im 116/158  lung]
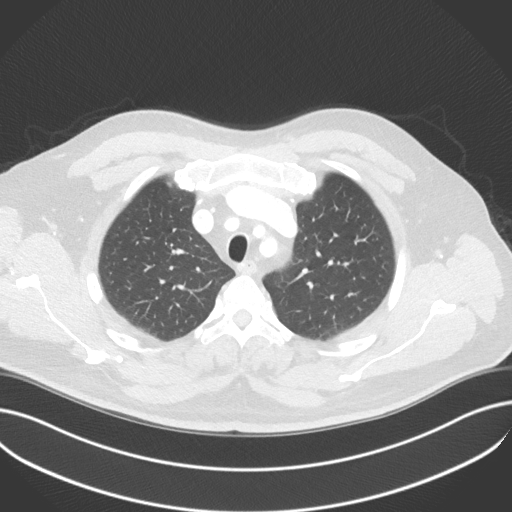
[im 126/158  soft-tissue]
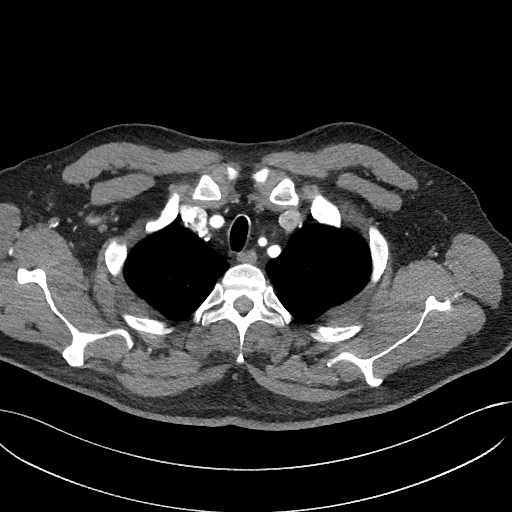
[im 137/158  lung]
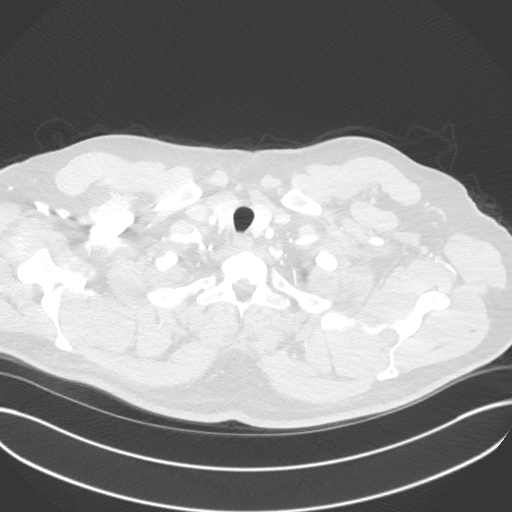
[im 147/158  soft-tissue]
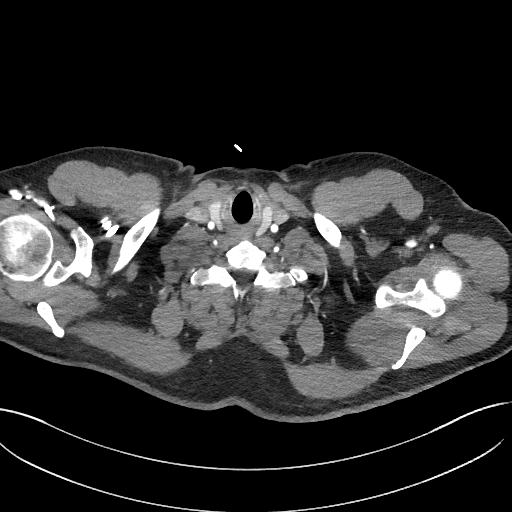

[Series 8: thoracic cta 2mm cor · coronal · 0.62mm/px · 3 of 148 slices shown]
[im 37/148  soft-tissue]
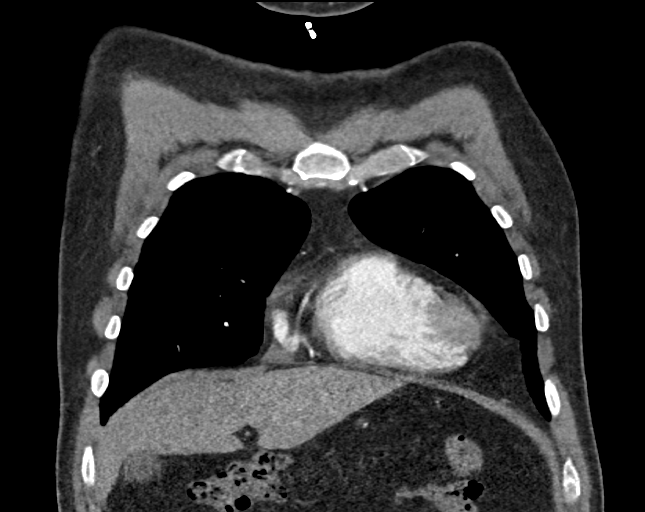
[im 74/148  soft-tissue]
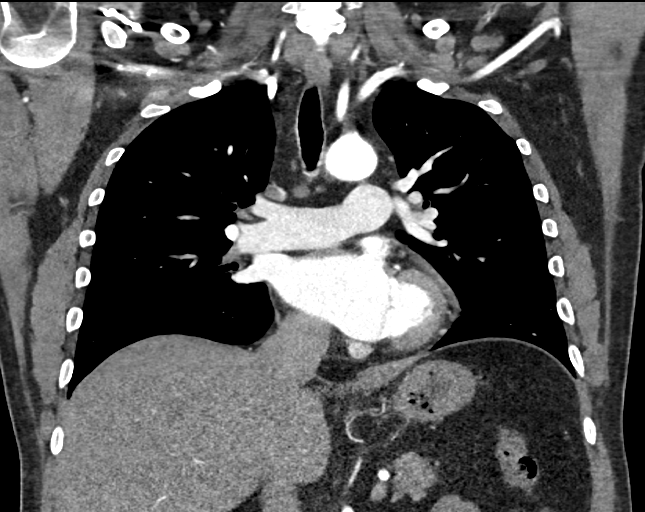
[im 111/148  soft-tissue]
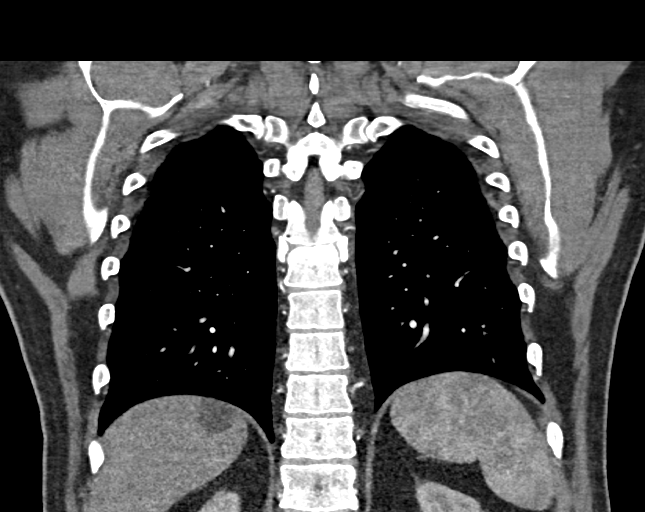

[17 of 46 positions shown; findings below may reference images not displayed]

FINDINGS: Cardiovascular: Left upper lobe pulmonary vein courses superiorly to
drain into the left innominate vein representing partial anomalous
pulmonary venous return. This correlates with the cardiac CTA
finding. No additional anomalous pulmonary venous drainage. Central
venous structures appear patent without Jhairo process.

There are 3 normal appearing pulmonary veins drain into the left
atrium including a right superior pulmonary vein and bilateral
inferior pulmonary veins. No pulmonary vein origin stenosis. Left
atrium and left atrial appendage appear patent without large
thrombus. Left atrium normal in caliber.

Intact aorta without aneurysm or dissection. No significant
atherosclerotic disease. Patent 3 vessel arch anatomy. Normal heart
size. No pericardial effusion.

Central pulmonary arteries are also patent. No large filling defect
or pulmonary embolus.

Mediastinum/Nodes: No enlarged mediastinal, hilar, or axillary lymph
nodes. Thyroid gland, trachea, and esophagus demonstrate no
significant findings.

Lungs/Pleura: Minor dependent bibasilar hypoventilatory changes
otherwise lungs are clear. No acute airspace process, collapse or
consolidation. No pleural abnormality, effusion or pneumothorax.
Trachea and central airways are patent.

Upper Abdomen: Posterior right hepatic dome subcapsular hypodense
lesion with low Hounsfield units most consistent with a hepatic
cyst. Additional posterior left hepatic 12 mm hypodense cyst noted.
No acute upper abdominal finding.

Musculoskeletal: Minor degenerative changes of the spine. No acute
osseous finding. No chest wall soft tissue abnormality or asymmetry.

Review of the MIP images confirms the above findings.
IMPRESSION: Partial anomalous pulmonary venous return of the left upper lobe
pulmonary vein, draining superiorly to the left innominate vein.

No additional anomalous pulmonary venous return.

No other acute intrathoracic vascular or non vascular finding.

Hepatic hypodense lesions, favored to be hepatic cysts.

## 2022-06-16 ENCOUNTER — Other Ambulatory Visit: Payer: Self-pay | Admitting: Family

## 2022-06-16 DIAGNOSIS — U071 COVID-19: Secondary | ICD-10-CM

## 2022-06-16 DIAGNOSIS — J029 Acute pharyngitis, unspecified: Secondary | ICD-10-CM

## 2022-08-08 ENCOUNTER — Ambulatory Visit: Payer: BC Managed Care – PPO | Attending: Cardiology | Admitting: Cardiology

## 2022-08-08 ENCOUNTER — Encounter: Payer: Self-pay | Admitting: Cardiology

## 2022-08-08 VITALS — BP 110/86 | HR 79 | Ht 74.0 in | Wt 256.2 lb

## 2022-08-08 DIAGNOSIS — E785 Hyperlipidemia, unspecified: Secondary | ICD-10-CM

## 2022-08-08 DIAGNOSIS — Z9889 Other specified postprocedural states: Secondary | ICD-10-CM | POA: Diagnosis not present

## 2022-08-08 DIAGNOSIS — I480 Paroxysmal atrial fibrillation: Secondary | ICD-10-CM | POA: Diagnosis not present

## 2022-08-08 DIAGNOSIS — G4733 Obstructive sleep apnea (adult) (pediatric): Secondary | ICD-10-CM | POA: Diagnosis not present

## 2022-08-08 DIAGNOSIS — Z8679 Personal history of other diseases of the circulatory system: Secondary | ICD-10-CM

## 2022-08-08 HISTORY — DX: Hyperlipidemia, unspecified: E78.5

## 2022-08-08 NOTE — Progress Notes (Signed)
Cardiology Office Note:    Date:  08/08/2022   ID:  Dennis [name], DOB 03-Mar-1966, MRN [id number]  PCP:  Angelina Sheriff, MD  Cardiologist:  Jenne Campus, MD    Referring MD: Angelina Sheriff, MD   Chief Complaint  Patient presents with   Medication Management  Doing fine  History of Present Illness:    Dennis [name] is a 56 y.o. male with past medical history significant for paroxysmal atrial fibrillation, status post atrial fibrillation ablation, obstructive sleep apnea on CPAP mask, dyslipidemia on pravastatin however some muscle aches.  He is coming to my office for follow-up overall doing very well arrhythmia wise.  Denies have any palpitations, no chest pain tightness squeezing pressure burning chest no palpitations he is very active he walks a lot and have no difficulty doing it  Past Medical History:  Diagnosis Date   Asthma    History of kidney stones    Renal disorder    kidney stones    Past Surgical History:  Procedure Laterality Date   ATRIAL FIBRILLATION ABLATION N/A 07/13/2021   Procedure: ATRIAL FIBRILLATION ABLATION;  Surgeon: Constance Haw, MD;  Location: [facility];  Service: Cardiovascular;  Laterality: N/A;   CARDIOVERSION N/A 06/19/2021   Procedure: CARDIOVERSION;  Surgeon: Pixie Casino, MD;  Location: [facility];  Service: Cardiovascular;  Laterality: N/A;   EXTRACORPOREAL SHOCK WAVE LITHOTRIPSY Left 03/28/2017   Procedure: LEFT EXTRACORPOREAL SHOCK WAVE LITHOTRIPSY (ESWL);  Surgeon: Franchot Gallo, MD;  Location: [facility];  Service: Urology;  Laterality: Left;   LITHOTRIPSY     STENT PLACE LEFT URETER ([facility] HX) Bilateral     Current Medications: Current Meds  Medication Sig   cetirizine (ZYRTEC) 10 MG tablet Take 1 tablet (10 mg total) by mouth daily.   diltiazem (CARDIZEM) 30 MG tablet Take 30 mg by mouth every 6 (six) hours as needed (heart rate>100.).   fluticasone (FLONASE) 50 MCG/ACT nasal spray SPRAY 2 SPRAYS  INTO EACH NOSTRIL EVERY DAY (Patient taking differently: Place 2 sprays into both nostrils daily.)   pravastatin (PRAVACHOL) 20 MG tablet Take 1 tablet (20 mg total) by mouth daily.   PROAIR HFA 108 (90 Base) MCG/ACT inhaler Inhale 2 puffs into the lungs every 6 (six) hours as needed for wheezing or shortness of breath.     Allergies:   Patient has no known allergies.   Social History   Socioeconomic History   Marital status: Married    Spouse name: Not on file   Number of children: Not on file   Years of education: Not on file   Highest education level: Not on file  Occupational History   Not on file  Tobacco Use   Smoking status: Former    Types: Cigars   Smokeless tobacco: Never   Tobacco comments:    Haven't smoke or drink since 04/24/2021  Vaping Use   Vaping Use: Never used  Substance and Sexual Activity   Alcohol use: No    Comment: socially   Drug use: No   Sexual activity: Yes  Other Topics Concern   Not on file  Social History Narrative   Not on file   Social Determinants of Health   Financial Resource Strain: Not on file  Food Insecurity: Not on file  Transportation Needs: Not on file  Physical Activity: Not on file  Stress: Not on file  Social Connections: Not on file     Family History: The  patient's family history includes Heart disease in his father; Lung cancer in his paternal grandfather. ROS:   Please see the history of present illness.    All 14 point review of systems negative except as described per history of present illness  EKGs/Labs/Other Studies Reviewed:      Recent Labs: No results found for requested labs within last 365 days.  Recent Lipid Panel No results found for: "CHOL", "TRIG", "HDL", "CHOLHDL", "VLDL", "LDLCALC", "LDLDIRECT"  Physical Exam:    VS:  BP 110/86 (BP Location: Left Arm, Patient Position: Sitting)   Pulse 79   Ht 6\' 2"  (1.88 m)   Wt 256 lb 3.2 oz (116.2 kg)   SpO2 94%   BMI 32.89 kg/m     Wt Readings  from Last 3 Encounters:  08/08/22 256 lb 3.2 oz (116.2 kg)  01/29/22 248 lb 12.8 oz (112.9 kg)  10/30/21 239 lb 9.6 oz (108.7 kg)     GEN:  Well nourished, well developed in no acute distress HEENT: Normal NECK: No JVD; No carotid bruits LYMPHATICS: No lymphadenopathy CARDIAC: RRR, no murmurs, no rubs, no gallops RESPIRATORY:  Clear to auscultation without rales, wheezing or rhonchi  ABDOMEN: Soft, non-tender, non-distended MUSCULOSKELETAL:  No edema; No deformity  SKIN: Warm and dry LOWER EXTREMITIES: no swelling NEUROLOGIC:  Alert and oriented x 3 PSYCHIATRIC:  Normal affect   ASSESSMENT:    1. Paroxysmal atrial fibrillation (HCC)   2. S/P ablation of atrial fibrillation   3. Obstructive sleep apnea   4. Dyslipidemia    PLAN:    In order of problems listed above:  Paroxysmal atrial fibrillation, denies having any.  Not anticoagulated CHADS2 Vascor very low.  Status post ablation Obstructive sleep apnea: Use CPAP mask on the regular basis very happy and satisfied with results Dyslipidemia: I did review his K PN which show me his total cholesterol of 226 and HDL of 39 however this is from October of last year a year ago he does have difficulty tolerating statin he is thinking about stopping antibiotics to ask him not to do that however we will try to do consent Q10 and see if it helps with his symptomatology.  If not then ask him to hold his statin for about 2 weeks to see if that helps with his symptomatology.  If it does then we will try nonstatin therapy.   Medication Adjustments/Labs and Tests Ordered: Current medicines are reviewed at length with the patient today.  Concerns regarding medicines are outlined above.  No orders of the defined types were placed in this encounter.  Medication changes: No orders of the defined types were placed in this encounter.   Signed, Park Liter, MD, [facility] 08/08/2022 11:33 AM    [place]

## 2022-08-08 NOTE — Patient Instructions (Signed)

## 2022-10-23 DIAGNOSIS — R102 Pelvic and perineal pain: Secondary | ICD-10-CM | POA: Diagnosis not present

## 2022-10-23 DIAGNOSIS — R3912 Poor urinary stream: Secondary | ICD-10-CM | POA: Diagnosis not present

## 2022-10-23 DIAGNOSIS — N401 Enlarged prostate with lower urinary tract symptoms: Secondary | ICD-10-CM | POA: Diagnosis not present

## 2022-11-26 ENCOUNTER — Ambulatory Visit: Payer: BC Managed Care – PPO | Attending: Cardiology | Admitting: Cardiology

## 2022-11-27 ENCOUNTER — Encounter: Payer: Self-pay | Admitting: Cardiology

## 2022-12-14 DIAGNOSIS — Z0000 Encounter for general adult medical examination without abnormal findings: Secondary | ICD-10-CM | POA: Diagnosis not present

## 2022-12-14 DIAGNOSIS — Z23 Encounter for immunization: Secondary | ICD-10-CM | POA: Diagnosis not present

## 2022-12-14 DIAGNOSIS — E7800 Pure hypercholesterolemia, unspecified: Secondary | ICD-10-CM | POA: Diagnosis not present

## 2022-12-14 DIAGNOSIS — Z6832 Body mass index (BMI) 32.0-32.9, adult: Secondary | ICD-10-CM | POA: Diagnosis not present

## 2022-12-14 DIAGNOSIS — Z125 Encounter for screening for malignant neoplasm of prostate: Secondary | ICD-10-CM | POA: Diagnosis not present

## 2022-12-29 ENCOUNTER — Other Ambulatory Visit: Payer: Self-pay | Admitting: Family

## 2022-12-29 DIAGNOSIS — J029 Acute pharyngitis, unspecified: Secondary | ICD-10-CM

## 2022-12-29 DIAGNOSIS — U071 COVID-19: Secondary | ICD-10-CM

## 2023-02-01 ENCOUNTER — Other Ambulatory Visit: Payer: Self-pay | Admitting: Family

## 2023-02-01 DIAGNOSIS — J029 Acute pharyngitis, unspecified: Secondary | ICD-10-CM

## 2023-02-01 DIAGNOSIS — U071 COVID-19: Secondary | ICD-10-CM

## 2023-02-21 ENCOUNTER — Other Ambulatory Visit: Payer: Self-pay | Admitting: Cardiology

## 2023-02-25 DIAGNOSIS — K635 Polyp of colon: Secondary | ICD-10-CM | POA: Diagnosis not present

## 2023-02-25 DIAGNOSIS — K625 Hemorrhage of anus and rectum: Secondary | ICD-10-CM | POA: Diagnosis not present

## 2023-03-16 ENCOUNTER — Emergency Department (HOSPITAL_COMMUNITY)
Admission: EM | Admit: 2023-03-16 | Discharge: 2023-03-16 | Disposition: A | Discharge status: Home / Self Care | Payer: BC Managed Care – PPO | Attending: Emergency Medicine | Admitting: Emergency Medicine

## 2023-03-16 DIAGNOSIS — Z7901 Long term (current) use of anticoagulants: Secondary | ICD-10-CM | POA: Insufficient documentation

## 2023-03-16 DIAGNOSIS — R002 Palpitations: Secondary | ICD-10-CM | POA: Diagnosis not present

## 2023-03-16 LAB — BASIC METABOLIC PANEL
Anion gap: 7 (ref 5–15)
BUN: 11 mg/dL (ref 6–20)
CO2: 26 mmol/L (ref 22–32)
Calcium: 9.1 mg/dL (ref 8.9–10.3)
Chloride: 105 mmol/L (ref 98–111)
Creatinine, Ser: 1.14 mg/dL (ref 0.61–1.24)
GFR, Estimated: >60 mL/min (ref >60)
Glucose, Bld: 89 mg/dL (ref 70–99)
Potassium: 4.1 mmol/L (ref 3.5–5.1)
Sodium: 138 mmol/L (ref 135–145)

## 2023-03-16 LAB — CBC
HCT: 45.7 % (ref 39.0–52.0)
Hemoglobin: 15.6 g/dL (ref 13.0–17.0)
MCH: 31.8 pg (ref 26.0–34.0)
MCHC: 34.1 g/dL (ref 30.0–36.0)
MCV: 93.1 fL (ref 80.0–100.0)
Platelets: 259 10*3/uL (ref 150–400)
RBC: 4.91 MIL/uL (ref 4.22–5.81)
RDW: 12.3 % (ref 11.5–15.5)
WBC: 8.8 10*3/uL (ref 4.0–10.5)
nRBC: 0 % (ref 0.0–0.2)

## 2023-03-16 LAB — MAGNESIUM: Magnesium: 2.1 mg/dL (ref 1.7–2.4)

## 2023-03-16 NOTE — Discharge Instructions (Signed)
As we discussed your lab work is reassuring today, it is unclear whether or not you may have been going in and out of A-fib, I think it is reasonable to have another discussion with your cardiologist about whether or not you should be taking your blood thinner or diltiazem medication, I think that is reasonable to discharge from the emergency department today without placing you back on your blood thinner.  If you have persistent abnormal heart rhythm, it feels like you are unable to get back into normal rhythm, you begin having significant chest pain, shortness of breath please return to the emergency department for further evaluation and management.

## 2023-03-16 NOTE — ED Triage Notes (Signed)
Pt reports that he was putting together a grill and felt that his HR became irregular. Denied CP and SOB. States that he has a hx of afib with an ablation in Oct 2022.

## 2023-03-16 NOTE — ED Provider Notes (Signed)
[place] EMERGENCY DEPARTMENT AT [facility] Provider Note   CSN: [id number] Arrival date & time: 03/16/23  1214     History  Chief Complaint  Patient presents with   Irregular Heart Beat    Dennis [name] is a 57 y.o. male with past medical history significant for SVT, paroxysmal A-fib who is status post ablation 2 years ago who presents with concern for abnormal heart rhythm this morning.  Patient reports that he was working on his grill when he felt his heart go into an abnormal rhythm.  Patient denies any chest pain, shortness of breath.  Patient reports that the symptoms resolved spontaneously a few moments later.  Patient reports no return of the rhythm since then, he endorses no ongoing chest pain, shortness of breath, fever, chills.  Patient reports that he has been generally tired recently but otherwise has no complaints.  No history of electrolyte abnormality.  He previously was with CHA2DS2-VASc score of 0 and had not been placed on anticoagulation.  He is not currently taking his diltiazem as it was found he did not need it status post ablation.  Patient denies any alcohol, drug use.  HPI     Home Medications Prior to Admission medications   Medication Sig Start Date End Date Taking? Authorizing Provider  cetirizine (ZYRTEC) 10 MG tablet Take 1 tablet (10 mg total) by mouth daily. 06/04/21   Junie Spencer, FNP  diltiazem (CARDIZEM) 30 MG tablet Take 30 mg by mouth every 6 (six) hours as needed (heart rate>100.).    [provider]  fluticasone (FLONASE) 50 MCG/ACT nasal spray SPRAY 2 SPRAYS INTO EACH NOSTRIL EVERY DAY Patient taking differently: Place 2 sprays into both nostrils daily. 03/20/22   Jannifer Rodney A, FNP  pravastatin (PRAVACHOL) 20 MG tablet Take 1 tablet (20 mg total) by mouth daily. 02/21/23   Georgeanna Lea, MD  PROAIR HFA 108 [phone] Base) MCG/ACT inhaler Inhale 2 puffs into the lungs every 6 (six) hours as needed for wheezing or  shortness of breath. 11/07/17   [provider]      Allergies    Patient has no known allergies.    Review of Systems   Review of Systems  All other systems reviewed and are negative.   Physical Exam Updated Vital Signs BP 107/82   Pulse 77   Temp 98 F (36.7 C) (Oral)   Resp 15   SpO2 96%  Physical Exam Vitals and nursing note reviewed.  Constitutional:      General: He is not in acute distress.    Appearance: Normal appearance.  HENT:     Head: Normocephalic and atraumatic.  Eyes:     General:        Right eye: No discharge.        Left eye: No discharge.  Cardiovascular:     Rate and Rhythm: Normal rate and regular rhythm.     Heart sounds: No murmur heard.    No friction rub. No gallop.     Comments: Normal rate, rhythm, no murmurs, rubs, gallops Pulmonary:     Effort: Pulmonary effort is normal.     Breath sounds: Normal breath sounds.     Comments: No accessory breath sounds noted on exam Abdominal:     General: Bowel sounds are normal.     Palpations: Abdomen is soft.  Skin:    General: Skin is warm and dry.     Capillary Refill: Capillary refill takes  less than 2 seconds.  Neurological:     Mental Status: He is alert and oriented to person, place, and time.  Psychiatric:        Mood and Affect: Mood normal.        Behavior: Behavior normal.     ED Results / Procedures / Treatments   Labs (all labs ordered are listed, but only abnormal results are displayed) Labs Reviewed  CBC  BASIC METABOLIC PANEL  MAGNESIUM    EKG EKG Interpretation  Date/Time:  Saturday Mar 16 2023 12:21:51 EDT Ventricular Rate:  84 PR Interval:  146 QRS Duration: 84 QT Interval:  357 QTC Calculation: 422 R Axis:   -59 Text Interpretation: Sinus rhythm LAD, consider left anterior fascicular block Anterior infarct, old No significant change was found Confirmed by Glynn Octave [phone]) on 03/16/2023 12:23:20 PM  Radiology No results  found.  Procedures Procedures    Medications Ordered in ED Medications - No data to display  ED Course/ Medical Decision Making/ A&P                             Medical Decision Making Amount and/or Complexity of Data Reviewed Labs: ordered.   This patient is a 57 y.o. male  who presents to the ED for concern of irregular heartbeat.   Differential diagnoses prior to evaluation: The emergent differential diagnosis includes, but is not limited to, SVT, A-fib, V. tach or other tachyarrhythmia, ACS, AAS, PE, versus other, anxiety, panic attack. This is not an exhaustive differential.   Past Medical History / Co-morbidities / Social History: History of paroxysmal A-fib, not currently on rate control medication or anticoagulation at this time status post an ablation 2 years ago.  Additional history: Chart reviewed. Pertinent results include: Reviewed outpatient previous cardiology notes, he had had discussion about being on anticoagulation previously and decision was made to forego as he has a low CHA2DS2-VASc score and has been mostly A-fib free since his ablation  Physical Exam: Physical exam performed. The pertinent findings include: This is an overall well-appearing patient with stable vital signs on initial exam other than some mild hypertension, blood pressure 139/96.  Normal oxygen saturation on room air.  He has been in normal sinus rhythm since his arrival in the emergency department.  Lab Tests/Imaging studies: I personally interpreted labs/imaging and the pertinent results include: CBC, BMP, magnesium all within normal limits.  Cardiac monitoring: EKG obtained and interpreted by my attending physician which shows: Normal sinus rhythm, the tracing on patient's phone from when he was reportedly in an abnormal rhythm is very difficult to interpret, with a regular baseline.  It is equivocal whether or not he may have been in A-fib, given his history I do have some suspicion that  he could be having some paroxysmal A-fib especially with his intermittent generalized fatigue, he has remained in normal sinus rhythm during his ED evaluation however.   Medications: We discussed beginning his Eliquis again versus having him follow-up with his cardiologist, he remains with a CHA2DS2-VASc score of 0, and I think that he is stable to discharge without bleeding on Eliquis based on his presentation in the ED today, patient understands and agrees to this plan.   Disposition: After consideration of the diagnostic results and the patients response to treatment, I feel that patient is stable for discharge with encouragement of cardiology follow-up at his earliest convenience.Marland Kitchen   emergency department workup does not suggest an  emergent condition requiring admission or immediate intervention beyond what has been performed at this time. The plan is: as above. The patient is safe for discharge and has been instructed to return immediately for worsening symptoms, change in symptoms or any other concerns.  Final Clinical Impression(s) / ED Diagnoses Final diagnoses:  Palpitations    Rx / DC Orders ED Discharge Orders     None         [place] 03/16/23 1357    Glynn Octave, MD 03/16/23 1726

## 2023-03-25 ENCOUNTER — Other Ambulatory Visit: Payer: Self-pay

## 2023-03-27 DIAGNOSIS — Z09 Encounter for follow-up examination after completed treatment for conditions other than malignant neoplasm: Secondary | ICD-10-CM | POA: Diagnosis not present

## 2023-03-27 DIAGNOSIS — G4733 Obstructive sleep apnea (adult) (pediatric): Secondary | ICD-10-CM | POA: Diagnosis not present

## 2023-03-27 DIAGNOSIS — Z86010 Personal history of colonic polyps: Secondary | ICD-10-CM | POA: Diagnosis not present

## 2023-03-27 DIAGNOSIS — Z1211 Encounter for screening for malignant neoplasm of colon: Secondary | ICD-10-CM | POA: Diagnosis not present

## 2023-03-27 DIAGNOSIS — K5730 Diverticulosis of large intestine without perforation or abscess without bleeding: Secondary | ICD-10-CM | POA: Diagnosis not present

## 2023-03-27 DIAGNOSIS — K648 Other hemorrhoids: Secondary | ICD-10-CM | POA: Diagnosis not present

## 2023-03-31 NOTE — Progress Notes (Signed)
 " Cardiology Office Note:    Date:  04/01/2023   ID:  Dennis [name], DOB [birth year], MRN [id number]  PCP:  Dottie Norleen PHEBE PONCE, MD   [facility] Providers Cardiologist:  Redell Leiter, MD Electrophysiologist:  Will Gladis Norton, MD     Referring MD: Dottie Norleen PHEBE PONCE, MD   CC: ED follow up   History of Present Illness:    Dennis [name] is a 57 y.o. male with a hx of SVT, persistent atrial fibrillation s/p ablation 07/13/2021, OSA on BiPAP, palpitations, anemia.  Developed atrial fibrillation, underwent DCCV but did not convert to sinus rhythm.  S/p ablation on 07/13/2021.  CHA2DS2-VASc score was 0, and he was not anticoagulated.   Most recently evaluated by Dr. Krasowski on 08/08/2022 at that time he was doing well from a cardiac perspective, he was remaining very active.   Most recently evaluated in the emergency department on 03/16/2023 for palpitations, EKG revealed normal sinus rhythm however patient had an EKG on his phone with artifact, hard to determine if his phone revealed atrial fibs or not.  CBC, BMP, magnesium all WNL.   He presents today accompanied by his wife after ED visit as outlined above.  States that he was working on putting a grill together and noticed that it felt like his heart was racing.  He took 2 EKGs on his Apple Watch, that were not conclusive, personally reviewed these readings there is much variability in the baseline and hard to decipher if this is episodes of atrial fibrillation or not.  His wife does state that over the last few weeks he has been more tired than normal, needing to come home and take a nap which is very atypical for him, otherwise he offers no complaints.  He is compliant with his BiPAP. He denies chest pain, dyspnea, pnd, orthopnea, n, v, dizziness, syncope, edema, weight gain, or early satiety.   Past Medical History:  Diagnosis Date   Asthma    Chest pain in adult 10/24/2017   Dyslipidemia 08/08/2022   Excessive daytime  sleepiness 07/11/2021   Fatigue 04/07/2019   History of kidney stones    Obstructive sleep apnea 05/30/2021   Palpitations 10/24/2017   Paroxysmal atrial fibrillation (HCC) 04/27/2021   Persistent atrial fibrillation (HCC)    Renal disorder    kidney stones   S/P ablation of atrial fibrillation 09/26/2021   SOB (shortness of breath) 10/24/2017   SVT (supraventricular tachycardia) 04/07/2019    Past Surgical History:  Procedure Laterality Date   ATRIAL FIBRILLATION ABLATION N/A 07/13/2021   Procedure: ATRIAL FIBRILLATION ABLATION;  Surgeon: Norton Soyla Gladis, MD;  Location: [facility] INVASIVE CV LAB;  Service: Cardiovascular;  Laterality: N/A;   CARDIOVERSION N/A 06/19/2021   Procedure: CARDIOVERSION;  Surgeon: Mona Vinie BROCKS, MD;  Location: [facility] ENDOSCOPY;  Service: Cardiovascular;  Laterality: N/A;   EXTRACORPOREAL SHOCK WAVE LITHOTRIPSY Left 03/28/2017   Procedure: LEFT EXTRACORPOREAL SHOCK WAVE LITHOTRIPSY (ESWL);  Surgeon: Matilda Senior, MD;  Location: [facility] ORS;  Service: Urology;  Laterality: Left;   LITHOTRIPSY     STENT PLACE LEFT URETER ([facility] HX) Bilateral     Current Medications: Current Meds  Medication Sig   cetirizine  (ZYRTEC ) 10 MG tablet Take 1 tablet (10 mg total) by mouth daily.   fluticasone  (FLONASE ) 50 MCG/ACT nasal spray SPRAY 2 SPRAYS INTO EACH NOSTRIL EVERY DAY   montelukast (SINGULAIR) 10 MG tablet Take 10 mg by mouth daily.   pravastatin  (PRAVACHOL ) 20 MG tablet Take  1 tablet (20 mg total) by mouth daily.   PROAIR HFA 108 (90 Base) MCG/ACT inhaler Inhale 2 puffs into the lungs every 6 (six) hours as needed for wheezing or shortness of breath.   tamsulosin  (FLOMAX ) 0.4 MG CAPS capsule Take 0.4 mg by mouth daily.     Allergies:   Patient has no known allergies.   Social History   Socioeconomic History   Marital status: Married    Spouse name: Not on file   Number of children: Not on file   Years of education: Not on file   Highest education level: Not on  file  Occupational History   Not on file  Tobacco Use   Smoking status: Former    Types: Cigars   Smokeless tobacco: Never   Tobacco comments:    Haven't smoke or drink since 04/24/2021  Vaping Use   Vaping Use: Never used  Substance and Sexual Activity   Alcohol use: No    Comment: socially   Drug use: No   Sexual activity: Yes  Other Topics Concern   Not on file  Social History Narrative   Not on file   Social Determinants of Health   Financial Resource Strain: Not on file  Food Insecurity: Not on file  Transportation Needs: Not on file  Physical Activity: Not on file  Stress: Not on file  Social Connections: Not on file     Family History: The patient's family history includes Heart disease in his father; Lung cancer in his paternal grandfather.  ROS:   Please see the history of present illness.     All other systems reviewed and are negative.  EKGs/Labs/Other Studies Reviewed:    The following studies were reviewed today: He denies chest pain, palpitations, dyspnea, pnd, orthopnea, n, v, dizziness, syncope, edema, weight gain, or early satiety.   EKG:  EKG is not ordered today.  EKG reviewed in the emergency department, revealed normal sinus rhythm.  Recent Labs: 03/16/2023: BUN 11; Creatinine, Ser 1.14; Hemoglobin 15.6; Magnesium 2.1; Platelets 259; Potassium 4.1; Sodium 138  Recent Lipid Panel No results found for: CHOL, TRIG, HDL, CHOLHDL, VLDL, LDLCALC, LDLDIRECT   Risk Assessment/Calculations:    CHA2DS2-VASc Score = 0   This indicates a 0.2% annual risk of stroke. The patient's score is based upon: CHF History: 0 HTN History: 0 Diabetes History: 0 Stroke History: 0 Vascular Disease History: 0 Age Score: 0 Gender Score: 0                Physical Exam:    VS:  BP 108/78   Pulse 76   Ht 6' 2 (1.88 m)   Wt 248 lb 6.4 oz (112.7 kg)   SpO2 95%   BMI 31.89 kg/m     Wt Readings from Last 3 Encounters:  04/01/23 248 lb  6.4 oz (112.7 kg)  08/08/22 256 lb 3.2 oz (116.2 kg)  01/29/22 248 lb 12.8 oz (112.9 kg)     GEN: Appears younger than stated age, well nourished, well developed in no acute distress HEENT: Normal NECK: No JVD; No carotid bruits LYMPHATICS: No lymphadenopathy CARDIAC: RRR, no murmurs, rubs, gallops RESPIRATORY:  Clear to auscultation without rales, wheezing or rhonchi  ABDOMEN: Soft, non-tender, non-distended MUSCULOSKELETAL:  No edema; No deformity  SKIN: Warm and dry NEUROLOGIC:  Alert and oriented x 3 PSYCHIATRIC:  Normal affect   ASSESSMENT:    1. Palpitations   2. Paroxysmal atrial fibrillation (HCC)   3. S/P ablation of  atrial fibrillation   4. Obstructive sleep apnea   5. Fatigue, unspecified type    PLAN:    In order of problems listed above:  PAF/palpitations-s/p ablation in 2022, CHA2DS2-VASc score of 0.  Currently not on any anticoagulation.  Was previously on diltiazem  however this has been discontinued by EP.  We discussed restarting this however, he would prefer to wait until we obtain monitor results.  Will arrange for 2-week ZIO monitor.  Will check TSH.  Magnesium, CBC, BMET were all WNL at ED visit. Fatigue-leading up to episode of palpitations he advised he felt fatigued for a few weeks, needing to take a nap in the afternoon which is very atypical for him.  Will check TSH. OSA-compliant with BiPAP.  Disposition-check TSH, 2-week monitor to assess for recurrence of atrial fibrillation.  Return in 6 weeks.           Medication Adjustments/Labs and Tests Ordered: Current medicines are reviewed at length with the patient today.  Concerns regarding medicines are outlined above.  Orders Placed This Encounter  Procedures   TSH   T3, free   T4, free   LONG TERM MONITOR (3-14 DAYS)   No orders of the defined types were placed in this encounter.   Patient Instructions  Medication Instructions:  Your physician recommends that you continue on your current  medications as directed. Please refer to the Current Medication list given to you today.  *If you need a refill on your cardiac medications before your next appointment, please call your pharmacy*   Lab Work: Your physician recommends that you have labs done in the office today. Your test included TSH, free T3 and free T4. If you have labs (blood work) drawn today and your tests are completely normal, you will receive your results only by: MyChart Message (if you have MyChart) OR A paper copy in the mail If you have any lab test that is abnormal or we need to change your treatment, we will call you to review the results.   Testing/Procedures: A zio monitor was ordered today. It will remain on for 14 days. Remove 04/15/23. You will then return monitor and event diary in provided box. It takes 1-2 weeks for report to be downloaded and returned to us . We will call you with the results. If monitor falls off or has orange flashing light, please call Zio for further instructions.     Follow-Up: At [facility], you and your health needs are our priority.  As part of our continuing mission to provide you with exceptional heart care, we have created designated Provider Care Teams.  These Care Teams include your primary Cardiologist (physician) and Advanced Practice Providers (APPs -  Physician Assistants and Nurse Practitioners) who all work together to provide you with the care you need, when you need it.  We recommend signing up for the patient portal called MyChart.  Sign up information is provided on this After Visit Summary.  MyChart is used to connect with patients for Virtual Visits (Telemedicine).  Patients are able to view lab/test results, encounter notes, upcoming appointments, etc.  Non-urgent messages can be sent to your provider as well.   To learn more about what you can do with MyChart, go to [email or web address].    Your next appointment:   6 week(s)  The format for  your next appointment:   In Person  Provider:   Lamar Fitch, MD    Other Instructions none  Important Information About Sugar  Signed, Delon JAYSON Hoover, NP  04/01/2023 10:02 AM    [facility] "

## 2023-04-01 ENCOUNTER — Ambulatory Visit (INDEPENDENT_AMBULATORY_CARE_PROVIDER_SITE_OTHER): Payer: BC Managed Care – PPO

## 2023-04-01 ENCOUNTER — Ambulatory Visit: Payer: BC Managed Care – PPO | Attending: Cardiology | Admitting: Cardiology

## 2023-04-01 ENCOUNTER — Encounter: Payer: Self-pay | Admitting: Cardiology

## 2023-04-01 VITALS — BP 108/78 | HR 76 | Ht 74.0 in | Wt 248.4 lb

## 2023-04-01 DIAGNOSIS — R002 Palpitations: Secondary | ICD-10-CM

## 2023-04-01 DIAGNOSIS — I480 Paroxysmal atrial fibrillation: Secondary | ICD-10-CM

## 2023-04-01 DIAGNOSIS — R5383 Other fatigue: Secondary | ICD-10-CM

## 2023-04-01 DIAGNOSIS — G4733 Obstructive sleep apnea (adult) (pediatric): Secondary | ICD-10-CM

## 2023-04-01 DIAGNOSIS — Z9889 Other specified postprocedural states: Secondary | ICD-10-CM | POA: Diagnosis not present

## 2023-04-01 DIAGNOSIS — Z8679 Personal history of other diseases of the circulatory system: Secondary | ICD-10-CM

## 2023-04-01 NOTE — Patient Instructions (Addendum)
Medication Instructions:  Your physician recommends that you continue on your current medications as directed. Please refer to the Current Medication list given to you today.  *If you need a refill on your cardiac medications before your next appointment, please call your pharmacy*   Lab Work: Your physician recommends that you have labs done in the office today. Your test included TSH, free T3 and free T4. If you have labs (blood work) drawn today and your tests are completely normal, you will receive your results only by: MyChart Message (if you have MyChart) OR A paper copy in the mail If you have any lab test that is abnormal or we need to change your treatment, we will call you to review the results.   Testing/Procedures: A zio monitor was ordered today. It will remain on for 14 days. Remove 04/15/23. You will then return monitor and event diary in provided box. It takes 1-2 weeks for report to be downloaded and returned to [place]. We will call you with the results. If monitor falls off or has orange flashing light, please call Zio for further instructions.     Follow-Up: At [facility], you and your health needs are our priority.  As part of our continuing mission to provide you with exceptional heart care, we have created designated Provider Care Teams.  These Care Teams include your primary Cardiologist (physician) and Advanced Practice Providers (APPs -  Physician Assistants and Nurse Practitioners) who all work together to provide you with the care you need, when you need it.  We recommend signing up for the patient portal called "MyChart".  Sign up information is provided on this After Visit Summary.  MyChart is used to connect with patients for Virtual Visits (Telemedicine).  Patients are able to view lab/test results, encounter notes, upcoming appointments, etc.  Non-urgent messages can be sent to your provider as well.   To learn more about what you can do with MyChart, go  to [email or web address].    Your next appointment:   6 week(s)  The format for your next appointment:   In Person  Provider:   Gypsy Balsam, MD    Other Instructions none  Important Information About Sugar

## 2023-04-02 LAB — TSH: TSH: 0.753 u[IU]/mL (ref 0.450–4.500)

## 2023-04-02 LAB — T3, FREE: T3, Free: 3 pg/mL (ref 2.0–4.4)

## 2023-04-02 LAB — T4, FREE: Free T4: 1.27 ng/dL (ref 0.82–1.77)

## 2023-04-11 DIAGNOSIS — R002 Palpitations: Secondary | ICD-10-CM | POA: Diagnosis not present

## 2023-04-11 DIAGNOSIS — I480 Paroxysmal atrial fibrillation: Secondary | ICD-10-CM | POA: Diagnosis not present

## 2023-04-16 DIAGNOSIS — H40013 Open angle with borderline findings, low risk, bilateral: Secondary | ICD-10-CM | POA: Diagnosis not present

## 2023-05-13 ENCOUNTER — Telehealth: Payer: Self-pay | Admitting: *Deleted

## 2023-05-13 NOTE — Telephone Encounter (Signed)
Gave pt monitor results. He stated he had an appt with Dr. Bing Matter tomorrow and could discuss results then. Pt had no further questions.

## 2023-05-13 NOTE — Telephone Encounter (Signed)
-----   Message from Flossie Dibble sent at 05/08/2023 11:49 AM EDT ----- Mr. Stansbery, Monitor showed that you had no episodes of atrial fibrillation. You did have several episodes where your heart started racing, which is likely what you were experiencing the day you went to the ED. Overall, these are not worrisome, but we could start back the metoprolol to suppress the frequency of them. Start Toprol 50 mg once daily. Overall, stable monitor result.

## 2023-05-14 ENCOUNTER — Ambulatory Visit: Payer: BC Managed Care – PPO | Attending: Cardiology | Admitting: Cardiology

## 2023-05-14 ENCOUNTER — Encounter: Payer: Self-pay | Admitting: Cardiology

## 2023-05-14 VITALS — BP 122/82 | HR 80 | Ht 74.0 in | Wt 248.0 lb

## 2023-05-14 DIAGNOSIS — Z9889 Other specified postprocedural states: Secondary | ICD-10-CM

## 2023-05-14 DIAGNOSIS — I471 Supraventricular tachycardia, unspecified: Secondary | ICD-10-CM

## 2023-05-14 DIAGNOSIS — I480 Paroxysmal atrial fibrillation: Secondary | ICD-10-CM | POA: Diagnosis not present

## 2023-05-14 DIAGNOSIS — Z8679 Personal history of other diseases of the circulatory system: Secondary | ICD-10-CM

## 2023-05-14 DIAGNOSIS — E785 Hyperlipidemia, unspecified: Secondary | ICD-10-CM | POA: Diagnosis not present

## 2023-05-14 MED ORDER — DILTIAZEM HCL ER COATED BEADS 120 MG PO CP24: 120.0000 mg | ORAL_CAPSULE | Freq: Every day | ORAL | 3 refills | Status: DC

## 2023-05-14 NOTE — Patient Instructions (Addendum)
Medication Instructions:   START: Cardizem CD 120mg  1 tablet daily   Lab Work: None Ordered If you have labs (blood work) drawn today and your tests are completely normal, you will receive your results only by: MyChart Message (if you have MyChart) OR A paper copy in the mail If you have any lab test that is abnormal or we need to change your treatment, we will call you to review the results.   Testing/Procedures: None Ordered   Follow-Up: At [facility], you and your health needs are our priority.  As part of our continuing mission to provide you with exceptional heart care, we have created designated Provider Care Teams.  These Care Teams include your primary Cardiologist (physician) and Advanced Practice Providers (APPs -  Physician Assistants and Nurse Practitioners) who all work together to provide you with the care you need, when you need it.  We recommend signing up for the patient portal called "MyChart".  Sign up information is provided on this After Visit Summary.  MyChart is used to connect with patients for Virtual Visits (Telemedicine).  Patients are able to view lab/test results, encounter notes, upcoming appointments, etc.  Non-urgent messages can be sent to your provider as well.   To learn more about what you can do with MyChart, go to [email or web address].    Your next appointment:   6 month(s)  The format for your next appointment:   In Person  Provider:   Gypsy Balsam, MD    Other Instructions NA

## 2023-05-14 NOTE — Progress Notes (Signed)
Cardiology Office Note:    Date:  05/14/2023   ID:  [name], DOB 04-Sep-1966, MRN [id number]  PCP:  Noni Saupe, MD  Cardiologist:  Gypsy Balsam, MD    Referring MD: Noni Saupe, MD   Chief Complaint  Patient presents with   Follow-up    History of Present Illness:    Dennis [name] is a 57 y.o. male with past medical history significant for paroxysmal atrial fibrillation, status post atrial fibrillation ablation, obstructive sleep apnea on CPAP mask, dyslipidemia on pravastatin however did have some muscle aches.  Comes today to my office after being seen by our nurse practitioner a few weeks ago was complaining of palpitations did have a chronic from some Apple Watch but poor quality uninterpretable, he did wear monitor which shows some supraventricular tachycardia.  Past Medical History:  Diagnosis Date   Asthma    Chest pain in adult 10/24/2017   Dyslipidemia 08/08/2022   Excessive daytime sleepiness 07/11/2021   Fatigue 04/07/2019   History of kidney stones    Obstructive sleep apnea 05/30/2021   Palpitations 10/24/2017   Paroxysmal atrial fibrillation (HCC) 04/27/2021   Persistent atrial fibrillation (HCC)    Renal disorder    kidney stones   S/P ablation of atrial fibrillation 09/26/2021   SOB (shortness of breath) 10/24/2017   SVT (supraventricular tachycardia) 04/07/2019    Past Surgical History:  Procedure Laterality Date   ATRIAL FIBRILLATION ABLATION N/A 07/13/2021   Procedure: ATRIAL FIBRILLATION ABLATION;  Surgeon: Regan Lemming, MD;  Location: [facility];  Service: Cardiovascular;  Laterality: N/A;   CARDIOVERSION N/A 06/19/2021   Procedure: CARDIOVERSION;  Surgeon: [name], MD;  Location: [facility] ENDOSCOPY;  Service: Cardiovascular;  Laterality: N/A;   EXTRACORPOREAL SHOCK WAVE LITHOTRIPSY Left 03/28/2017   Procedure: LEFT EXTRACORPOREAL SHOCK WAVE LITHOTRIPSY (ESWL);  Surgeon: Marcine Matar, MD;  Location:  [facility] ORS;  Service: Urology;  Laterality: Left;   LITHOTRIPSY     STENT PLACE LEFT URETER ([facility] HX) Bilateral     Current Medications: Current Meds  Medication Sig   cetirizine (ZYRTEC) 10 MG tablet Take 1 tablet (10 mg total) by mouth daily.   fluticasone (FLONASE) 50 MCG/ACT nasal spray SPRAY 2 SPRAYS INTO EACH NOSTRIL EVERY DAY   montelukast (SINGULAIR) 10 MG tablet Take 10 mg by mouth daily.   pravastatin (PRAVACHOL) 20 MG tablet Take 1 tablet (20 mg total) by mouth daily.   PROAIR HFA 108 (90 Base) MCG/ACT inhaler Inhale 2 puffs into the lungs every 6 (six) hours as needed for wheezing or shortness of breath.   tamsulosin (FLOMAX) 0.4 MG CAPS capsule Take 0.4 mg by mouth daily.     Allergies:   Patient has no known allergies.   Social History   Socioeconomic History   Marital status: Married    Spouse name: Not on file   Number of children: Not on file   Years of education: Not on file   Highest education level: Not on file  Occupational History   Not on file  Tobacco Use   Smoking status: Former    Types: Cigars   Smokeless tobacco: Never   Tobacco comments:    Haven't smoke or drink since 04/24/2021  Vaping Use   Vaping status: Never Used  Substance and Sexual Activity   Alcohol use: No    Comment: socially   Drug use: No   Sexual activity: Yes  Other Topics Concern   Not  on file  Social History Narrative   Not on file   Social Determinants of Health   Financial Resource Strain: Not on file  Food Insecurity: Not on file  Transportation Needs: Not on file  Physical Activity: Not on file  Stress: Not on file  Social Connections: Not on file     Family History: The patient's family history includes Heart disease in his father; Lung cancer in his paternal grandfather. ROS:   Please see the history of present illness.    All 14 point review of systems negative except as described per history of present illness  EKGs/Labs/Other Studies Reviewed:          Recent Labs: 03/16/2023: BUN 11; Creatinine, Ser 1.14; Hemoglobin 15.6; Magnesium 2.1; Platelets 259; Potassium 4.1; Sodium 138 04/01/2023: TSH 0.753  Recent Lipid Panel No results found for: "CHOL", "TRIG", "HDL", "CHOLHDL", "VLDL", "LDLCALC", "LDLDIRECT"  Physical Exam:    VS:  BP 122/82 (BP Location: Right Arm, Patient Position: Sitting)   Pulse 80   Ht 6\' 2"  (1.88 m)   Wt 248 lb (112.5 kg)   SpO2 95%   BMI 31.84 kg/m     Wt Readings from Last 3 Encounters:  05/14/23 248 lb (112.5 kg)  04/01/23 248 lb 6.4 oz (112.7 kg)  08/08/22 256 lb 3.2 oz (116.2 kg)     GEN:  Well nourished, well developed in no acute distress HEENT: Normal NECK: No JVD; No carotid bruits LYMPHATICS: No lymphadenopathy CARDIAC: RRR, no murmurs, no rubs, no gallops RESPIRATORY:  Clear to auscultation without rales, wheezing or rhonchi  ABDOMEN: Soft, non-tender, non-distended MUSCULOSKELETAL:  No edema; No deformity  SKIN: Warm and dry LOWER EXTREMITIES: no swelling NEUROLOGIC:  Alert and oriented x 3 PSYCHIATRIC:  Normal affect   ASSESSMENT:    1. Paroxysmal atrial fibrillation (HCC)   2. SVT (supraventricular tachycardia)   3. S/P ablation of atrial fibrillation   4. Dyslipidemia    PLAN:    In order of problems listed above:  Paroxysmal atrial fibrillation.  Looks like no documented episode of recurrences, status post ablation, CHADS2 Vascor is 0.  No need to anticoagulate.  Monitor shows supraventricular tachycardia will reinitiate diltiazem long-acting 120 daily to see if it help with symptomatology. Supraventricular tachycardia documented on monitor.  Plan as described above. Dyslipidemia I did review K PN which show me total cholesterol 96 HDL 45.  He is on pravastatin which I will continue.   Medication Adjustments/Labs and Tests Ordered: Current medicines are reviewed at length with the patient today.  Concerns regarding medicines are outlined above.  No orders of the defined  types were placed in this encounter.  Medication changes: No orders of the defined types were placed in this encounter.   Signed, Georgeanna Lea, MD, [facility] 05/14/2023 1:20 PM    [facility]

## 2023-05-14 NOTE — Addendum Note (Signed)
Addended by: Baldo Ash D on: 05/14/2023 01:26 PM   Modules accepted: Orders

## 2023-05-19 ENCOUNTER — Other Ambulatory Visit: Payer: Self-pay | Admitting: Cardiology

## 2023-09-23 DIAGNOSIS — R509 Fever, unspecified: Secondary | ICD-10-CM | POA: Diagnosis not present

## 2023-09-23 DIAGNOSIS — R0981 Nasal congestion: Secondary | ICD-10-CM | POA: Diagnosis not present

## 2023-10-25 DIAGNOSIS — N200 Calculus of kidney: Secondary | ICD-10-CM | POA: Diagnosis not present

## 2023-10-25 DIAGNOSIS — R3912 Poor urinary stream: Secondary | ICD-10-CM | POA: Diagnosis not present

## 2023-10-25 DIAGNOSIS — N401 Enlarged prostate with lower urinary tract symptoms: Secondary | ICD-10-CM | POA: Diagnosis not present

## 2023-11-14 ENCOUNTER — Encounter: Payer: Self-pay | Admitting: Cardiology

## 2023-11-14 ENCOUNTER — Ambulatory Visit: Payer: BC Managed Care – PPO | Attending: Cardiology | Admitting: Cardiology

## 2023-11-14 VITALS — BP 116/82 | HR 80 | Ht 74.0 in | Wt 263.8 lb

## 2023-11-14 DIAGNOSIS — Z9889 Other specified postprocedural states: Secondary | ICD-10-CM | POA: Diagnosis not present

## 2023-11-14 DIAGNOSIS — E785 Hyperlipidemia, unspecified: Secondary | ICD-10-CM

## 2023-11-14 DIAGNOSIS — G4733 Obstructive sleep apnea (adult) (pediatric): Secondary | ICD-10-CM

## 2023-11-14 DIAGNOSIS — I471 Supraventricular tachycardia, unspecified: Secondary | ICD-10-CM

## 2023-11-14 DIAGNOSIS — I480 Paroxysmal atrial fibrillation: Secondary | ICD-10-CM

## 2023-11-14 DIAGNOSIS — Z8679 Personal history of other diseases of the circulatory system: Secondary | ICD-10-CM

## 2023-11-14 NOTE — Patient Instructions (Signed)
Medication Instructions:  Your physician recommends that you continue on your current medications as directed. Please refer to the Current Medication list given to you today.  *If you need a refill on your cardiac medications before your next appointment, please call your pharmacy*   Lab Work: NONE If you have labs (blood work) drawn today and your tests are completely normal, you will receive your results only by: MyChart Message (if you have MyChart) OR A paper copy in the mail If you have any lab test that is abnormal or we need to change your treatment, we will call you to review the results.   Testing/Procedures: NONE   Follow-Up: At [facility], you and your health needs are our priority.  As part of our continuing mission to provide you with exceptional heart care, we have created designated Provider Care Teams.  These Care Teams include your primary Cardiologist (physician) and Advanced Practice Providers (APPs -  Physician Assistants and Nurse Practitioners) who all work together to provide you with the care you need, when you need it.  We recommend signing up for the patient portal called "MyChart".  Sign up information is provided on this After Visit Summary.  MyChart is used to connect with patients for Virtual Visits (Telemedicine).  Patients are able to view lab/test results, encounter notes, upcoming appointments, etc.  Non-urgent messages can be sent to your provider as well.   To learn more about what you can do with MyChart, go to [email or web address].    Your next appointment:   1 year(s)  Provider:   Gypsy Balsam, MD    Other Instructions

## 2023-11-14 NOTE — Progress Notes (Signed)
Cardiology Office Note:    Date:  11/14/2023   ID:  MOHMAD SCAVUZZO, DOB 10/06/1966, MRN [id number]  PCP:  Noni Saupe, MD   [facility] Providers Cardiologist:  Gypsy Balsam, MD Electrophysiologist:  Will Jorja Loa, MD     Referring MD: Noni Saupe, MD    History of Present Illness:    Dennis [name] is a 58 y.o. male with a hx of SVT, persistent atrial fibrillation s/p ablation 07/13/2021, OSA on BiPAP, palpitations, anemia.  04/01/2023 monitor average heart rate 76 bpm, 17 runs of SVT, no atrial fibrillation. 08/08/2021 echo technically difficult study with suboptimal views 07/13/2021 atrial fibrillation ablation 07/07/2021 coronary CTA calcium score 0  Most recently evaluated in the emergency department on 03/16/2023 for palpitations, EKG revealed normal sinus rhythm however patient had an EKG on his phone with artifact, hard to determine if his phone revealed atrial fibs or not.  CBC, BMP, magnesium all WNL.   Evaluated 04/01/2023 following episode of palpitations/heart racing >> he checked his EKG on his Apple Watch x 2 and they were not conclusive.  We repeated a monitor which revealed episodes of SVT, no recurrent atrial fibrillation, we started him on diltiazem.  He presents today for follow-up of his atrial fibrillation and SVT.  He has had no further episodes since he was started on diltiazem.  He offers no formal complaints from a cardiac perspective.  He would like to try to lose some weight, plans to see his PCP for physical in the next few weeks. He denies chest pain, palpitations, dyspnea, pnd, orthopnea, n, v, dizziness, syncope, edema, weight gain, or early satiety.   Past Medical History:  Diagnosis Date   Asthma    Chest pain in adult 10/24/2017   Dyslipidemia 08/08/2022   Excessive daytime sleepiness 07/11/2021   Fatigue 04/07/2019   History of kidney stones    Obstructive sleep apnea 05/30/2021   Palpitations 10/24/2017    Paroxysmal atrial fibrillation (HCC) 04/27/2021   Persistent atrial fibrillation (HCC)    Renal disorder    kidney stones   S/P ablation of atrial fibrillation 09/26/2021   SOB (shortness of breath) 10/24/2017   SVT (supraventricular tachycardia) (HCC) 04/07/2019    Past Surgical History:  Procedure Laterality Date   ATRIAL FIBRILLATION ABLATION N/A 07/13/2021   Procedure: ATRIAL FIBRILLATION ABLATION;  Surgeon: Regan Lemming, MD;  Location: [facility] INVASIVE CV LAB;  Service: Cardiovascular;  Laterality: N/A;   CARDIOVERSION N/A 06/19/2021   Procedure: CARDIOVERSION;  Surgeon: [name], MD;  Location: [facility] ENDOSCOPY;  Service: Cardiovascular;  Laterality: N/A;   EXTRACORPOREAL SHOCK WAVE LITHOTRIPSY Left 03/28/2017   Procedure: LEFT EXTRACORPOREAL SHOCK WAVE LITHOTRIPSY (ESWL);  Surgeon: Marcine Matar, MD;  Location: [facility] ORS;  Service: Urology;  Laterality: Left;   LITHOTRIPSY     STENT PLACE LEFT URETER ([facility] HX) Bilateral     Current Medications: Current Meds  Medication Sig   cetirizine (ZYRTEC) 10 MG tablet Take 1 tablet (10 mg total) by mouth daily.   diltiazem (CARDIZEM CD) 120 MG 24 hr capsule Take 1 capsule (120 mg total) by mouth daily.   fluticasone (FLONASE) 50 MCG/ACT nasal spray SPRAY 2 SPRAYS INTO EACH NOSTRIL EVERY DAY   montelukast (SINGULAIR) 10 MG tablet Take 10 mg by mouth daily.   pravastatin (PRAVACHOL) 20 MG tablet Take 1 tablet (20 mg total) by mouth daily.   PROAIR HFA 108 (90 Base) MCG/ACT inhaler Inhale 2 puffs into the lungs  every 6 (six) hours as needed for wheezing or shortness of breath.   tamsulosin (FLOMAX) 0.4 MG CAPS capsule Take 0.4 mg by mouth daily.     Allergies:   Patient has no known allergies.   Social History   Socioeconomic History   Marital status: Married    Spouse name: Not on file   Number of children: Not on file   Years of education: Not on file   Highest education level: Not on file  Occupational History   Not on  file  Tobacco Use   Smoking status: Former    Types: Cigars   Smokeless tobacco: Never   Tobacco comments:    Haven't smoke or drink since 04/24/2021  Vaping Use   Vaping status: Never Used  Substance and Sexual Activity   Alcohol use: No    Comment: socially   Drug use: No   Sexual activity: Yes  Other Topics Concern   Not on file  Social History Narrative   Not on file   Social Drivers of Health   Financial Resource Strain: Not on file  Food Insecurity: Not on file  Transportation Needs: Not on file  Physical Activity: Not on file  Stress: Not on file  Social Connections: Not on file     Family History: The patient's family history includes Heart disease in his father; Lung cancer in his paternal grandfather.  ROS:   Please see the history of present illness.     All other systems reviewed and are negative.  EKGs/Labs/Other Studies Reviewed:    The following studies were reviewed today: He denies chest pain, palpitations, dyspnea, pnd, orthopnea, n, v, dizziness, syncope, edema, weight gain, or early satiety.   EKG:  EKG is not ordered today.  EKG reviewed in the emergency department, revealed normal sinus rhythm.  Recent Labs: 03/16/2023: BUN 11; Creatinine, Ser 1.14; Hemoglobin 15.6; Magnesium 2.1; Platelets 259; Potassium 4.1; Sodium 138 04/01/2023: TSH 0.753  Recent Lipid Panel No results found for: "CHOL", "TRIG", "HDL", "CHOLHDL", "VLDL", "LDLCALC", "LDLDIRECT"   Risk Assessment/Calculations:    CHA2DS2-VASc Score = 0   This indicates a 0.2% annual risk of stroke. The patient's score is based upon: CHF History: 0 HTN History: 0 Diabetes History: 0 Stroke History: 0 Vascular Disease History: 0 Age Score: 0 Gender Score: 0                Physical Exam:    VS:  BP 116/82   Pulse 80   Ht 6\' 2"  (1.88 m)   Wt 263 lb 12.8 oz (119.7 kg)   SpO2 96%   BMI 33.87 kg/m     Wt Readings from Last 3 Encounters:  11/14/23 263 lb 12.8 oz (119.7 kg)   05/14/23 248 lb (112.5 kg)  04/01/23 248 lb 6.4 oz (112.7 kg)     GEN: Appears younger than stated age, well nourished, well developed in no acute distress HEENT: Normal NECK: No JVD; No carotid bruits LYMPHATICS: No lymphadenopathy CARDIAC: RRR, no murmurs, rubs, gallops RESPIRATORY:  Clear to auscultation without rales, wheezing or rhonchi  ABDOMEN: Soft, non-tender, non-distended MUSCULOSKELETAL:  No edema; No deformity  SKIN: Warm and dry NEUROLOGIC:  Alert and oriented x 3 PSYCHIATRIC:  Normal affect   ASSESSMENT:    1. Paroxysmal atrial fibrillation (HCC)   2. SVT (supraventricular tachycardia) (HCC)   3. S/P ablation of atrial fibrillation   4. Dyslipidemia   5. Obstructive sleep apnea     PLAN:  In order of problems listed above:  PAF/palpitations/SVT-s/p ablation in 2022, CHA2DS2-VASc score of 0.  Bothered over the summer by palpitations/concerns of recurrent atrial fibrillation.  We repeated a monitor which revealed episodes of SVT and we started him on diltiazem.  He has not had any further episodes of palpitations.  Continue diltiazem 120 mg daily.  OSA-compliant with BiPAP.  Dyslipidemia-this is formally followed by his PCP, currently on Pravachol.  Disposition-follow-up in 1 year, sooner if needed.           Medication Adjustments/Labs and Tests Ordered: Current medicines are reviewed at length with the patient today.  Concerns regarding medicines are outlined above.  No orders of the defined types were placed in this encounter.  No orders of the defined types were placed in this encounter.   Patient Instructions  Medication Instructions:  Your physician recommends that you continue on your current medications as directed. Please refer to the Current Medication list given to you today.  *If you need a refill on your cardiac medications before your next appointment, please call your pharmacy*   Lab Work: NONE If you have labs (blood work)  drawn today and your tests are completely normal, you will receive your results only by: MyChart Message (if you have MyChart) OR A paper copy in the mail If you have any lab test that is abnormal or we need to change your treatment, we will call you to review the results.   Testing/Procedures: NONE   Follow-Up: At [facility] you and your health needs are our priority.  As part of our continuing mission to provide you with exceptional heart care, we have created designated Provider Care Teams.  These Care Teams include your primary Cardiologist (physician) and Advanced Practice Providers (APPs -  Physician Assistants and Nurse Practitioners) who all work together to provide you with the care you need, when you need it.  We recommend signing up for the patient portal called "MyChart".  Sign up information is provided on this After Visit Summary.  MyChart is used to connect with patients for Virtual Visits (Telemedicine).  Patients are able to view lab/test results, encounter notes, upcoming appointments, etc.  Non-urgent messages can be sent to your provider as well.   To learn more about what you can do with MyChart, go to [email or web address].    Your next appointment:   1 year(s)  Provider:   Gypsy Balsam, MD    Other Instructions          Signed, Flossie Dibble, NP  11/14/2023 1:22 PM    [facility]

## 2023-12-25 DIAGNOSIS — N401 Enlarged prostate with lower urinary tract symptoms: Secondary | ICD-10-CM | POA: Diagnosis not present

## 2023-12-25 DIAGNOSIS — R3912 Poor urinary stream: Secondary | ICD-10-CM | POA: Diagnosis not present

## 2023-12-25 DIAGNOSIS — N200 Calculus of kidney: Secondary | ICD-10-CM | POA: Diagnosis not present

## 2024-03-02 ENCOUNTER — Encounter (HOSPITAL_BASED_OUTPATIENT_CLINIC_OR_DEPARTMENT_OTHER): Payer: Self-pay | Admitting: Family Medicine

## 2024-03-02 ENCOUNTER — Ambulatory Visit (HOSPITAL_BASED_OUTPATIENT_CLINIC_OR_DEPARTMENT_OTHER): Admitting: Family Medicine

## 2024-03-02 VITALS — BP 121/81 | HR 71 | Temp 98.1°F | Resp 16 | Ht 74.02 in | Wt 262.8 lb

## 2024-03-02 DIAGNOSIS — Z0000 Encounter for general adult medical examination without abnormal findings: Secondary | ICD-10-CM

## 2024-03-02 DIAGNOSIS — R5383 Other fatigue: Secondary | ICD-10-CM

## 2024-03-02 DIAGNOSIS — E785 Hyperlipidemia, unspecified: Secondary | ICD-10-CM

## 2024-03-02 DIAGNOSIS — G4733 Obstructive sleep apnea (adult) (pediatric): Secondary | ICD-10-CM

## 2024-03-02 NOTE — Assessment & Plan Note (Signed)
 Generally well as above.  Promptly request his old records from [place].  He declines surgical referral for his hernia.  Await labs and f/u with him soon, especially regarding his fatigue.  I will likely offer to refer him to a dietitian.  Will regroup in a few months after getting his old records to be sure all issues have been addressed.

## 2024-03-02 NOTE — Progress Notes (Unsigned)
 Complete physical exam  Patient: Dennis [name]   DOB: [birth year]   58 y.o. Male  MRN: [id number]  Subjective:     Chief Complaint  Patient presents with   Establish Care    Pt. Here to establish care.     Dennis [name] is a 58 y.o. male who presents today for a complete physical exam. He has struggled some with his diet and exercise.  Exercise is limited by busy work schedule. He feels well except for fatigue and doesn't really want to exercise even on his day off.  He does feel his OSA is controlled, and feels like his sleeps soundly.  He reports caffeine and alcohol in moderation. His old records from [facility] yet been received.  He knows he needs to work harder on his diet.   Most recent fall risk assessment:    03/02/2024    8:57 AM  Fall Risk   Falls in the past year? 0  Number falls in past yr: 0  Injury with Fall? 0  Risk for fall due to : No Fall Risks  Follow up Falls evaluation completed     Most recent depression screenings:    03/02/2024    8:57 AM  PHQ 2/9 Scores  PHQ - 2 Score 3  PHQ- 9 Score 5    {VISON DENTAL STD PSA (Optional):27386}  Past Medical History:  Diagnosis Date   Asthma    Dyslipidemia 08/08/2022   History of kidney stones    Obstructive sleep apnea 05/30/2021   Stable on Bipap   Paroxysmal atrial fibrillation (HCC) 04/27/2021   (history of) f/by Dr. Gordan Latina   Peripheral neuropathy    f/by [facility]   S/P ablation of atrial fibrillation 09/26/2021   Past Surgical History:  Procedure Laterality Date   ATRIAL FIBRILLATION ABLATION N/A 07/13/2021   Procedure: ATRIAL FIBRILLATION ABLATION;  Surgeon: Lei Pump, MD;  Location: [facility] INVASIVE CV LAB;  Service: Cardiovascular;  Laterality: N/A;   CARDIOVERSION N/A 06/19/2021   Procedure: CARDIOVERSION;  Surgeon: Hazle Lites, MD;  Location: [facility] ENDOSCOPY;  Service: Cardiovascular;  Laterality: N/A;   EXTRACORPOREAL SHOCK WAVE LITHOTRIPSY Left 03/28/2017    Procedure: LEFT EXTRACORPOREAL SHOCK WAVE LITHOTRIPSY (ESWL);  Surgeon: Trent Frizzle, MD;  Location: [facility] ORS;  Service: Urology;  Laterality: Left;   LITHOTRIPSY     STENT PLACE LEFT URETER ([facility] HX) Bilateral      Family History  Problem Relation Age of Onset   Heart disease Father    Lung cancer Paternal Grandfather       Patient Care Team: Madelyne Schiff, MD as PCP - General (Family Medicine) Lei Pump, MD as PCP - Electrophysiology (Cardiology) Manfred Seed, MD as PCP - Cardiology (Cardiology) Manfred Seed, MD as Consulting Physician (Cardiology)   Outpatient Medications Prior to Visit  Medication Sig   cetirizine  (ZYRTEC ) 10 MG tablet Take 1 tablet (10 mg total) by mouth daily.   diltiazem  (CARDIZEM  CD) 120 MG 24 hr capsule Take 1 capsule (120 mg total) by mouth daily.   fluticasone  (FLONASE ) 50 MCG/ACT nasal spray SPRAY 2 SPRAYS INTO EACH NOSTRIL EVERY DAY   montelukast (SINGULAIR) 10 MG tablet Take 10 mg by mouth daily.   pravastatin  (PRAVACHOL ) 20 MG tablet Take 1 tablet (20 mg total) by mouth daily.   PROAIR HFA 108 (90 Base) MCG/ACT inhaler Inhale 2 puffs into the lungs every 6 (six) hours as needed for wheezing or shortness  of breath.   tamsulosin  (FLOMAX ) 0.4 MG CAPS capsule Take 0.4 mg by mouth daily.   No facility-administered medications prior to visit.    Review of Systems  Constitutional:  Positive for malaise/fatigue.  HENT: Negative.    Eyes: Negative.   Respiratory: Negative.    Cardiovascular: Negative.   Gastrointestinal: Negative.   Genitourinary: Negative.   Musculoskeletal: Negative.   Skin: Negative.   Neurological: Negative.   Endo/Heme/Allergies: Negative.   Psychiatric/Behavioral: Negative.    All other systems reviewed and are negative.         Objective:     BP 121/81   Pulse 71   Temp 98.1 F (36.7 C) (Oral)   Resp 16   Ht 6' 2.02" (1.88 m)   Wt 262 lb 12.8 oz (119.2 kg)   SpO2 97%   BMI  33.73 kg/m  {Vitals History (Optional):23777}  Physical Exam Constitutional:      General: He is not in acute distress.    Comments: WDWN, Obese  HENT:     Head: Normocephalic.     Right Ear: Tympanic membrane normal.     Left Ear: Tympanic membrane normal.     Nose: Nose normal.     Mouth/Throat:     Pharynx: Oropharynx is clear.  Eyes:     Extraocular Movements: Extraocular movements intact.     Pupils: Pupils are equal, round, and reactive to light.  Neck:     Thyroid : No thyroid  mass, thyromegaly or thyroid  tenderness.     Vascular: No carotid bruit.  Cardiovascular:     Rate and Rhythm: Normal rate and regular rhythm.     Pulses: Normal pulses.     Heart sounds: Normal heart sounds.  Pulmonary:     Breath sounds: Normal breath sounds.  Abdominal:     General: Bowel sounds are normal. There is no distension.     Palpations: Abdomen is soft. There is no mass.     Tenderness: There is no abdominal tenderness.     Hernia: No hernia is present.     Comments: Small, reducible umbilical hernia noted.  Genitourinary:    Penis: Normal.      Testes: Normal.  Musculoskeletal:        General: No tenderness. Normal range of motion.     Cervical back: Normal range of motion and neck supple. No tenderness.     Right lower leg: No edema.     Left lower leg: No edema.  Lymphadenopathy:     Cervical: No cervical adenopathy.  Skin:    General: Skin is warm and dry.     Findings: No lesion.  Neurological:     General: No focal deficit present.     Mental Status: He is alert.  Psychiatric:        Mood and Affect: Mood normal.      No results found for any visits on 03/02/24. {Show previous labs (optional):23779}    Assessment & Plan:    Routine Health Maintenance and Physical Exam  Immunization History  Administered Date(s) Administered   Influenza, Quadrivalent, Recombinant, Inj, Pf 08/03/2021   Tdap 11/17/2019   Zoster Recombinant(Shingrix) 12/14/2022    Health  Maintenance  Topic Date Due   HIV Screening  Never done   Hepatitis C Screening  Never done   Pneumococcal Vaccine 44-5 Years old (1 of 2 - PCV) Never done   Zoster Vaccines- Shingrix (2 of 2) 02/08/2023   COVID-19 Vaccine (1) 03/18/2024 (Originally 04/16/1971)  INFLUENZA VACCINE  05/22/2024   DTaP/Tdap/Td (2 - Td or Tdap) 11/16/2029   Colonoscopy  03/26/2033   HPV VACCINES  Aged Out   Meningococcal B Vaccine  Aged Out    Discussed health benefits of physical activity, and encouraged him to engage in regular exercise appropriate for his age and condition.  Encounter for wellness examination in adult -     CBC with Differential/Platelet -     Comprehensive metabolic panel with GFR -     Lipid panel  Fatigue, unspecified type Assessment & Plan: Generally well as above.  Promptly request his old records from [facility].  He declines surgical referral for his hernia.  Await labs and f/u with him soon, especially regarding his fatigue.  I will likely offer to refer him to a dietitian.  Will regroup in a few months after getting his old records to be sure all issues have been addressed.    Orders: -     Testosterone -     TSH -     Vitamin B12  Obstructive sleep apnea  Dyslipidemia     Return in about 3 months (around 06/02/2024) for chronic follow-up.     REDDING Reece Cane., MD

## 2024-03-03 LAB — COMPREHENSIVE METABOLIC PANEL WITH GFR
ALT: 18 IU/L (ref 0–44)
AST: 17 IU/L (ref 0–40)
Albumin: 4.4 g/dL (ref 3.8–4.9)
Alkaline Phosphatase: 78 IU/L (ref 44–121)
BUN/Creatinine Ratio: 12 (ref 9–20)
BUN: 13 mg/dL (ref 6–24)
Bilirubin Total: 0.6 mg/dL (ref 0.0–1.2)
CO2: 26 mmol/L (ref 20–29)
Calcium: 9.1 mg/dL (ref 8.7–10.2)
Chloride: 103 mmol/L (ref 96–106)
Creatinine, Ser: 1.1 mg/dL (ref 0.76–1.27)
Globulin, Total: 1.8 g/dL (ref 1.5–4.5)
Glucose: 90 mg/dL (ref 70–99)
Potassium: 4.7 mmol/L (ref 3.5–5.2)
Sodium: 141 mmol/L (ref 134–144)
Total Protein: 6.2 g/dL (ref 6.0–8.5)
eGFR: 79 mL/min/{1.73_m2} (ref >59)

## 2024-03-03 LAB — LIPID PANEL
Chol/HDL Ratio: 3.9 ratio (ref 0.0–5.0)
Cholesterol, Total: 178 mg/dL (ref 100–199)
HDL: 46 mg/dL (ref >39)
LDL Chol Calc (NIH): 109 mg/dL — ABNORMAL HIGH (ref 0–99)
Triglycerides: 126 mg/dL (ref 0–149)
VLDL Cholesterol Cal: 23 mg/dL (ref 5–40)

## 2024-03-03 LAB — CBC WITH DIFFERENTIAL/PLATELET
Basophils Absolute: 0.1 10*3/uL (ref 0.0–0.2)
Basos: 1 %
EOS (ABSOLUTE): 0.1 10*3/uL (ref 0.0–0.4)
Eos: 1 %
Hematocrit: 45 % (ref 37.5–51.0)
Hemoglobin: 14.9 g/dL (ref 13.0–17.7)
Immature Grans (Abs): 0 10*3/uL (ref 0.0–0.1)
Immature Granulocytes: 0 %
Lymphocytes Absolute: 1.9 10*3/uL (ref 0.7–3.1)
Lymphs: 25 %
MCH: 32.9 pg (ref 26.6–33.0)
MCHC: 33.1 g/dL (ref 31.5–35.7)
MCV: 99 fL — ABNORMAL HIGH (ref 79–97)
Monocytes Absolute: 0.8 10*3/uL (ref 0.1–0.9)
Monocytes: 10 %
Neutrophils Absolute: 4.9 10*3/uL (ref 1.4–7.0)
Neutrophils: 63 %
Platelets: 257 10*3/uL (ref 150–450)
RBC: 4.53 x10E6/uL (ref 4.14–5.80)
RDW: 13.1 % (ref 11.6–15.4)
WBC: 7.8 10*3/uL (ref 3.4–10.8)

## 2024-03-03 LAB — TESTOSTERONE: Testosterone: 419 ng/dL (ref 264–916)

## 2024-03-03 LAB — TSH: TSH: 1.17 u[IU]/mL (ref 0.450–4.500)

## 2024-03-03 LAB — VITAMIN B12: Vitamin B-12: 1489 pg/mL — ABNORMAL HIGH (ref 232–1245)

## 2024-03-04 ENCOUNTER — Ambulatory Visit (HOSPITAL_BASED_OUTPATIENT_CLINIC_OR_DEPARTMENT_OTHER): Payer: Self-pay | Admitting: Family Medicine

## 2024-03-26 DIAGNOSIS — R3912 Poor urinary stream: Secondary | ICD-10-CM | POA: Diagnosis not present

## 2024-03-26 DIAGNOSIS — N401 Enlarged prostate with lower urinary tract symptoms: Secondary | ICD-10-CM | POA: Diagnosis not present

## 2024-03-30 DIAGNOSIS — R3912 Poor urinary stream: Secondary | ICD-10-CM | POA: Diagnosis not present

## 2024-05-07 ENCOUNTER — Other Ambulatory Visit: Payer: Self-pay | Admitting: Cardiology

## 2024-05-23 ENCOUNTER — Other Ambulatory Visit: Payer: Self-pay | Admitting: Cardiology

## 2024-06-03 ENCOUNTER — Encounter (HOSPITAL_BASED_OUTPATIENT_CLINIC_OR_DEPARTMENT_OTHER): Payer: Self-pay | Admitting: Family Medicine

## 2024-06-03 ENCOUNTER — Encounter (HOSPITAL_BASED_OUTPATIENT_CLINIC_OR_DEPARTMENT_OTHER): Payer: Self-pay

## 2024-06-03 ENCOUNTER — Ambulatory Visit (HOSPITAL_BASED_OUTPATIENT_CLINIC_OR_DEPARTMENT_OTHER): Admitting: Family Medicine

## 2024-06-03 VITALS — BP 114/78 | HR 70 | Temp 98.2°F | Resp 16 | Wt 259.5 lb

## 2024-06-03 DIAGNOSIS — R350 Frequency of micturition: Secondary | ICD-10-CM

## 2024-06-03 DIAGNOSIS — N400 Enlarged prostate without lower urinary tract symptoms: Secondary | ICD-10-CM | POA: Insufficient documentation

## 2024-06-03 DIAGNOSIS — E785 Hyperlipidemia, unspecified: Secondary | ICD-10-CM | POA: Diagnosis not present

## 2024-06-03 DIAGNOSIS — K429 Umbilical hernia without obstruction or gangrene: Secondary | ICD-10-CM | POA: Insufficient documentation

## 2024-06-03 DIAGNOSIS — N401 Enlarged prostate with lower urinary tract symptoms: Secondary | ICD-10-CM | POA: Diagnosis not present

## 2024-06-03 DIAGNOSIS — E66811 Obesity, class 1: Secondary | ICD-10-CM | POA: Diagnosis not present

## 2024-06-03 DIAGNOSIS — Z6830 Body mass index (BMI) 30.0-30.9, adult: Secondary | ICD-10-CM

## 2024-06-03 DIAGNOSIS — E669 Obesity, unspecified: Secondary | ICD-10-CM | POA: Insufficient documentation

## 2024-06-03 DIAGNOSIS — E6609 Other obesity due to excess calories: Secondary | ICD-10-CM

## 2024-06-03 NOTE — Progress Notes (Signed)
 Established Patient Office Visit  Subjective   Patient ID: Dennis [name], male    DOB: 1966-04-14  Age: 58 y.o. MRN: [id number]  Chief Complaint  Patient presents with   Follow-up    Follow-up    F/u as above.  Please see recent notes for details.  Recent visit with Urology noted.  He is considering a prostate ablation in the coming months.  Ongoing efforts towards weight loss.    Past Medical History:  Diagnosis Date   Asthma    BPH (benign prostatic hyperplasia)    f/by [facility]   Dyslipidemia 08/08/2022   History of kidney stones    Obesity    Obstructive sleep apnea 05/30/2021   Stable on Bipap   Paroxysmal atrial fibrillation (HCC) 04/27/2021   (history of) f/by Dr. Bernie   Peripheral neuropathy    f/by [facility]   S/P ablation of atrial fibrillation 09/26/2021    Outpatient Encounter Medications as of 06/03/2024  Medication Sig   cetirizine  (ZYRTEC ) 10 MG tablet Take 1 tablet (10 mg total) by mouth daily.   diltiazem  (CARDIZEM  CD) 120 MG 24 hr capsule TAKE 1 CAPSULE BY MOUTH EVERY DAY   fluticasone  (FLONASE ) 50 MCG/ACT nasal spray SPRAY 2 SPRAYS INTO EACH NOSTRIL EVERY DAY   pravastatin  (PRAVACHOL ) 20 MG tablet TAKE 1 TABLET BY MOUTH EVERY DAY   PROAIR HFA 108 (90 Base) MCG/ACT inhaler Inhale 2 puffs into the lungs every 6 (six) hours as needed for wheezing or shortness of breath.   tamsulosin  (FLOMAX ) 0.4 MG CAPS capsule Take 0.4 mg by mouth daily.   montelukast (SINGULAIR) 10 MG tablet Take 10 mg by mouth daily.   No facility-administered encounter medications on file as of 06/03/2024.    Social History   Tobacco Use   Smoking status: Former    Types: Cigars   Smokeless tobacco: Never   Tobacco comments:    Haven't smoke or drink since 04/24/2021  Vaping Use   Vaping status: Never Used  Substance Use Topics   Alcohol use: No    Comment: socially   Drug use: No      Review of Systems  Constitutional:  Negative for diaphoresis,  fever, malaise/fatigue and weight loss.  Respiratory:  Negative for cough, shortness of breath and wheezing.   Cardiovascular:  Negative for chest pain, palpitations, orthopnea, claudication, leg swelling and PND.      Objective:     BP 114/78 (BP Location: Right Arm, Patient Position: Standing, Cuff Size: Normal)   Pulse 70   Temp 98.2 F (36.8 C) (Oral)   Resp 16   Wt 259 lb 8 oz (117.7 kg)   SpO2 94%   BMI 33.30 kg/m    Physical Exam Constitutional:      General: He is not in acute distress.    Appearance: Normal appearance. He is obese.  HENT:     Head: Normocephalic.  Neck:     Vascular: No carotid bruit.  Cardiovascular:     Rate and Rhythm: Normal rate and regular rhythm.     Pulses: Normal pulses.     Heart sounds: Normal heart sounds.  Pulmonary:     Effort: Pulmonary effort is normal.     Breath sounds: Normal breath sounds.  Abdominal:     General: Bowel sounds are normal.     Palpations: Abdomen is soft.     Comments: Reduceable hernia again noted.  Musculoskeletal:     Cervical back: Neck supple. No  tenderness.     Right lower leg: No edema.     Left lower leg: No edema.  Neurological:     Mental Status: He is alert.      No results found for any visits on 06/03/24.    The 10-year ASCVD risk score (Arnett DK, et al., 2019) is: 5.2%    Assessment & Plan:  Benign prostatic hyperplasia with urinary frequency Assessment & Plan: Stable.  Considering procedure.  F/u with Urology as directed.   Umbilical hernia without obstruction and without gangrene Assessment & Plan: Avoid heavy lifting.  Surgical referral.  Orders: -     Ambulatory referral to General Surgery  Dyslipidemia Assessment & Plan: Stable.  Continue weight loss efforts and alert me if he wishes to consider a GLP-1 in time.   Class 1 obesity due to excess calories without serious comorbidity with body mass index (BMI) of 30.0 to 30.9 in adult  I personally spent a total of  25 minutes in the care of the patient today including counseling and educating, referring and communicating with other health care professionals, documenting clinical information in the EHR, and communicating results.   No follow-ups on file.    REDDING PONCE NORLEEN FALCON., MD

## 2024-06-03 NOTE — Assessment & Plan Note (Signed)
 Avoid heavy lifting.  Surgical referral.

## 2024-06-03 NOTE — Assessment & Plan Note (Signed)
 Stable.  Continue weight loss efforts and alert me if he wishes to consider a GLP-1 in time.

## 2024-06-03 NOTE — Assessment & Plan Note (Signed)
 Stable.  Considering procedure.  F/u with Urology as directed.

## 2024-06-14 ENCOUNTER — Ambulatory Visit (HOSPITAL_BASED_OUTPATIENT_CLINIC_OR_DEPARTMENT_OTHER)
Admission: EM | Admit: 2024-06-14 | Discharge: 2024-06-14 | Disposition: A | Discharge status: Home / Self Care | Attending: Family Medicine | Admitting: Family Medicine

## 2024-06-14 ENCOUNTER — Encounter (HOSPITAL_BASED_OUTPATIENT_CLINIC_OR_DEPARTMENT_OTHER): Payer: Self-pay

## 2024-06-14 DIAGNOSIS — R051 Acute cough: Secondary | ICD-10-CM

## 2024-06-14 DIAGNOSIS — U071 COVID-19: Secondary | ICD-10-CM

## 2024-06-14 DIAGNOSIS — J3489 Other specified disorders of nose and nasal sinuses: Secondary | ICD-10-CM

## 2024-06-14 DIAGNOSIS — R509 Fever, unspecified: Secondary | ICD-10-CM

## 2024-06-14 LAB — POC SOFIA SARS ANTIGEN FIA: SARS Coronavirus 2 Ag: POSITIVE — AB

## 2024-06-14 MED ORDER — TRIAMCINOLONE ACETONIDE 40 MG/ML IJ SUSP
40.0000 mg | Freq: Once | INTRAMUSCULAR | Status: AC
  Administered 2024-06-14: 40 mg via INTRAMUSCULAR

## 2024-06-14 MED ORDER — PROMETHAZINE-DM 6.25-15 MG/5ML PO SYRP: 5.0000 mL | ORAL_SOLUTION | Freq: Four times a day (QID) | ORAL | 0 refills | Status: DC | PRN

## 2024-06-14 NOTE — ED Provider Notes (Addendum)
 Dennis [name]    CSN: [id number] Arrival date & time: 06/14/24  0931      History   Chief Complaint Chief Complaint  Patient presents with   Fever    HPI Dennis [name] is a 58 y.o. male.   58 year old male with report of sore throat, headache, cough and fever that started on 06/13/2024.  His fever was as high as 102.1 oral.  Acetaminophen  and Advil seem to be controlling his fever and bodyaches.  He is having significant sinus pressure and pain especially frontal sinus pain.  Clear nasal discharge so far.   Fever Associated symptoms: congestion, cough, rhinorrhea and sore throat   Associated symptoms: no chest pain, no chills, no diarrhea, no dysuria, no ear pain, no nausea, no rash and no vomiting     Past Medical History:  Diagnosis Date   Asthma    BPH (benign prostatic hyperplasia)    f/by [facility]   Dyslipidemia 08/08/2022   History of kidney stones    Obesity    Obstructive sleep apnea 05/30/2021   Stable on Bipap   Paroxysmal atrial fibrillation (HCC) 04/27/2021   (history of) f/by Dr. Bernie   Peripheral neuropathy    f/by [facility]   S/P ablation of atrial fibrillation 09/26/2021    Patient Active Problem List   Diagnosis Date Noted   BPH (benign prostatic hyperplasia) 06/03/2024   Umbilical hernia 06/03/2024   Obesity 06/03/2024   Encounter for wellness examination in adult 03/02/2024   Dyslipidemia 08/08/2022   S/P ablation of atrial fibrillation 09/26/2021   Excessive daytime sleepiness 07/11/2021   Persistent atrial fibrillation (HCC)    Obstructive sleep apnea 05/30/2021   Asthma 05/24/2021   History of kidney stones 05/24/2021   Renal disorder 05/24/2021   Paroxysmal atrial fibrillation (HCC) 04/27/2021   SVT (supraventricular tachycardia) (HCC) 04/07/2019   Fatigue 04/07/2019   Palpitations 10/24/2017   Chest pain in adult 10/24/2017    Past Surgical History:  Procedure Laterality Date   ATRIAL FIBRILLATION  ABLATION N/A 07/13/2021   Procedure: ATRIAL FIBRILLATION ABLATION;  Surgeon: Inocencio Soyla Lunger, MD;  Location: [facility] INVASIVE CV LAB;  Service: Cardiovascular;  Laterality: N/A;   CARDIOVERSION N/A 06/19/2021   Procedure: CARDIOVERSION;  Surgeon: Mona Vinie BROCKS, MD;  Location: [facility] ENDOSCOPY;  Service: Cardiovascular;  Laterality: N/A;   COLONOSCOPY N/A    circa 2024 per Dr. Larene   EXTRACORPOREAL SHOCK WAVE LITHOTRIPSY Left 03/28/2017   Procedure: LEFT EXTRACORPOREAL SHOCK WAVE LITHOTRIPSY (ESWL);  Surgeon: Matilda Senior, MD;  Location: [facility] ORS;  Service: Urology;  Laterality: Left;   LITHOTRIPSY     STENT PLACE LEFT URETER ([facility] HX) Bilateral        Home Medications    Prior to Admission medications   Medication Sig Start Date End Date Taking? Authorizing Provider  promethazine -dextromethorphan (PROMETHAZINE -DM) 6.25-15 MG/5ML syrup Take 5 mLs by mouth 4 (four) times daily as needed for cough. Do not use and drive - May make drowsy. 06/14/24  Yes Ival Domino, FNP  cetirizine  (ZYRTEC ) 10 MG tablet Take 1 tablet (10 mg total) by mouth daily. 06/04/21   Lavell Lye A, FNP  diltiazem  (CARDIZEM  CD) 120 MG 24 hr capsule TAKE 1 CAPSULE BY MOUTH EVERY DAY 05/07/24   Carlin Delon BROCKS, NP  fluticasone  (FLONASE ) 50 MCG/ACT nasal spray SPRAY 2 SPRAYS INTO EACH NOSTRIL EVERY DAY 03/20/22   Hawks, Lye A, FNP  montelukast (SINGULAIR) 10 MG tablet Take 10 mg by mouth daily. 03/12/23  [provider]  pravastatin  (PRAVACHOL ) 20 MG tablet TAKE 1 TABLET BY MOUTH EVERY DAY 05/25/24   Krasowski, Robert J, MD  tamsulosin  (FLOMAX ) 0.4 MG CAPS capsule Take 0.4 mg by mouth daily. 01/19/23   [provider]    Family History Family History  Problem Relation Age of Onset   Heart disease Father    Lung cancer Paternal Grandfather     Social History Social History   Tobacco Use   Smoking status: Former    Types: Cigars   Smokeless tobacco: Never   Tobacco comments:     Haven't smoke or drink since 04/24/2021  Vaping Use   Vaping status: Never Used  Substance Use Topics   Alcohol use: No    Comment: socially   Drug use: No     Allergies   Patient has no known allergies.   Review of Systems Review of Systems  Constitutional:  Positive for fever. Negative for chills.  HENT:  Positive for congestion, postnasal drip, rhinorrhea, sinus pressure, sinus pain and sore throat. Negative for ear pain.   Eyes:  Negative for pain and visual disturbance.  Respiratory:  Positive for cough.   Cardiovascular:  Negative for chest pain and palpitations.  Gastrointestinal:  Negative for abdominal pain, constipation, diarrhea, nausea and vomiting.  Genitourinary:  Negative for dysuria and hematuria.  Musculoskeletal:  Positive for arthralgias. Negative for back pain.  Skin:  Negative for color change and rash.  Neurological:  Negative for seizures and syncope.  All other systems reviewed and are negative.    Physical Exam Triage Vital Signs ED Triage Vitals  Encounter Vitals Group     BP 06/14/24 1012 110/75     Girls Systolic BP Percentile --      Girls Diastolic BP Percentile --      Boys Systolic BP Percentile --      Boys Diastolic BP Percentile --      Pulse Rate 06/14/24 1012 75     Resp 06/14/24 1012 20     Temp 06/14/24 1012 98.8 F (37.1 C)     Temp Source 06/14/24 1012 Oral     SpO2 06/14/24 1012 96 %     Weight --      Height --      Head Circumference --      Peak Flow --      Pain Score 06/14/24 1014 4     Pain Loc --      Pain Education --      Exclude from Growth Chart --    No data found.  Updated Vital Signs BP 110/75 (BP Location: Right Arm)   Pulse 75   Temp 98.8 F (37.1 C) (Oral)   Resp 20   SpO2 96%   Visual Acuity Right Eye Distance:   Left Eye Distance:   Bilateral Distance:    Right Eye Near:   Left Eye Near:    Bilateral Near:     Physical Exam Vitals and nursing note reviewed.  Constitutional:       General: He is not in acute distress.    Appearance: He is well-developed. He is ill-appearing. He is not toxic-appearing or diaphoretic.  HENT:     Head: Normocephalic and atraumatic.     Right Ear: Hearing, tympanic membrane, ear canal and external ear normal.     Left Ear: Hearing, tympanic membrane, ear canal and external ear normal.     Nose: Congestion and rhinorrhea present. Rhinorrhea is  clear.     Right Sinus: Maxillary sinus tenderness and frontal sinus tenderness present.     Left Sinus: Maxillary sinus tenderness and frontal sinus tenderness present.     Comments: Frontal sinus pain is moderate to severe.  Maxillary pain is mild.    Mouth/Throat:     Lips: Pink.     Mouth: Mucous membranes are moist.     Pharynx: Uvula midline. Posterior oropharyngeal erythema present. No oropharyngeal exudate.     Tonsils: No tonsillar exudate.  Eyes:     Conjunctiva/sclera: Conjunctivae normal.     Pupils: Pupils are equal, round, and reactive to light.  Cardiovascular:     Rate and Rhythm: Normal rate and regular rhythm.     Heart sounds: S1 normal and S2 normal. No murmur heard. Pulmonary:     Effort: Pulmonary effort is normal. No respiratory distress.     Breath sounds: Normal breath sounds. No decreased breath sounds, wheezing, rhonchi or rales.     Comments: Lungs are clear.  He has a wet sounding cough and a spasmodic cough.  Oxygen saturation is 96% on room air Abdominal:     General: Bowel sounds are normal.     Palpations: Abdomen is soft.     Tenderness: There is no abdominal tenderness.  Musculoskeletal:        General: No swelling.     Cervical back: Neck supple.  Lymphadenopathy:     Head:     Right side of head: No submental, submandibular, tonsillar, preauricular or posterior auricular adenopathy.     Left side of head: No submental, submandibular, tonsillar, preauricular or posterior auricular adenopathy.     Cervical: Cervical adenopathy present.     Right  cervical: Superficial cervical adenopathy present.     Left cervical: Superficial cervical adenopathy present.  Skin:    General: Skin is warm and dry.     Capillary Refill: Capillary refill takes less than 2 seconds.     Findings: No rash.  Neurological:     Mental Status: He is alert and oriented to person, place, and time.  Psychiatric:        Mood and Affect: Mood normal.      UC Treatments / Results  Labs (all labs ordered are listed, but only abnormal results are displayed) Labs Reviewed  POC SOFIA SARS ANTIGEN FIA - Abnormal; Notable for the following components:      Result Value   SARS Coronavirus 2 Ag Positive (*)    All other components within normal limits    EKG   Radiology No results found.  Procedures Procedures (including critical [name] time)  Medications Ordered in UC Medications  triamcinolone  acetonide (KENALOG -40) injection 40 mg (40 mg Intramuscular Given 06/14/24 1111)    Initial Impression / Assessment and Plan / UC Course  I have reviewed the triage vital signs and the nursing notes.  Pertinent labs & imaging results that were available during my [name] of the patient were reviewed by me and considered in my medical decision making (see chart for details).  Plan of [name]: COVID-19 infection with fever, cough and sinus pressure: This is a viral infection.  Encouraged sinus rinses twice daily.  Kenalog  40 mg injection now to open sinuses.  Promethazine  DM, 5 mL, every 6 hours as needed for cough.  Get plenty of fluids and rest.  See discharge instructions for patient instructions and quarantine recommendations.  Follow-up if symptoms do not improve, worsen or new symptoms occur.  I reviewed the plan of [name] with the patient and/or the patient's guardian.  The patient and/or guardian had time to ask questions and acknowledged that the questions were answered.  I provided instruction on symptoms or reasons to return here or to go to an ER, if  symptoms/condition did not improve, worsened or if new symptoms occurred.  Final Clinical Impressions(s) / UC Diagnoses   Final diagnoses:  COVID-19 virus infection  Acute cough  Fever, unspecified  Sinus pressure     Discharge Instructions      COVID-19 infection with cough, fever and sinus pressure.  Kenalog  40 mg injection now.  Encouraged sinus rinses twice daily.  Promethazine  DM, 5 mL, every 6 hours if needed for cough or congestion.  Get plenty of fluids and rest.  Should stay away from others until he is fever free for 24 hours.  After that he can quarantine if able but should be able to go out in public with the mask on.  Follow-up if symptoms do not improve, worsen or new symptoms occur.     ED Prescriptions     Medication Sig Dispense Auth. Provider   promethazine -dextromethorphan (PROMETHAZINE -DM) 6.25-15 MG/5ML syrup Take 5 mLs by mouth 4 (four) times daily as needed for cough. Do not use and drive - May make drowsy. 118 mL Ival Domino, FNP      PDMP not reviewed this encounter.   Ival Domino, FNP 06/14/24 1111    Ival Domino, FNP 06/14/24 [phone]

## 2024-06-14 NOTE — ED Triage Notes (Signed)
 Onset yesterday of sore throat, headache, cough, and fever of 102.1. Took tylenol  this morning.

## 2024-06-14 NOTE — Discharge Instructions (Signed)
 COVID-19 infection with cough, fever and sinus pressure.  Kenalog  40 mg injection now.  Encouraged sinus rinses twice daily.  Promethazine  DM, 5 mL, every 6 hours if needed for cough or congestion.  Get plenty of fluids and rest.  Should stay away from others until he is fever free for 24 hours.  After that he can quarantine if able but should be able to go out in public with the mask on.  Follow-up if symptoms do not improve, worsen or new symptoms occur.

## 2024-06-23 ENCOUNTER — Telehealth: Payer: Self-pay

## 2024-06-23 DIAGNOSIS — K429 Umbilical hernia without obstruction or gangrene: Secondary | ICD-10-CM | POA: Diagnosis not present

## 2024-06-23 NOTE — Telephone Encounter (Signed)
   Name: Dennis [name]  DOB: November 01, 1965  MRN: [id number]  Primary Cardiologist: Lamar Fitch, MD   Preoperative team, please contact this patient and set up a phone call appointment for further preoperative risk assessment. Please obtain consent and complete medication review. Thank you for your help.  I confirm that guidance regarding antiplatelet and oral anticoagulation therapy has been completed and, if necessary, noted below.  None requested   I also confirmed the patient resides in the state of [place] . As per [facility] [employer] telemedicine laws, the patient must reside in the state in which the provider is licensed.   [name], NP 06/23/2024, 3:28 PM [facility]

## 2024-06-23 NOTE — Telephone Encounter (Signed)
 Left message for pt to call our office and ask for the preop team to schedule TELE Preop appt.

## 2024-06-23 NOTE — Telephone Encounter (Signed)
   Pre-operative Risk Assessment    Patient Name: Dennis [name]  DOB: February 05, 1966 MRN: [id number]   Date of last office visit: 11/14/23 DELON HOOVER, NP Date of next office visit: NONE   Request for Surgical Clearance    Procedure:  UMBILICAL HERNIA WITH MESH  Date of Surgery:  Clearance 06/29/24                                Surgeon:  DR LYNWOOD SEARCH Surgeon's Group or Practice Name:  [facility] Phone number:  [phone] Fax number:  [phone]   Type of Clearance Requested:   - Medical    Type of Anesthesia:  General    Additional requests/questions:    SignedLucie DELENA Ku   06/23/2024, 3:03 PM

## 2024-06-24 ENCOUNTER — Telehealth: Payer: Self-pay

## 2024-06-24 NOTE — Telephone Encounter (Signed)
 Med Rec and Consent Done     Patient Consent for Virtual Visit        Dennis [name] has provided verbal consent on 06/24/2024 for a virtual visit (video or telephone).   CONSENT FOR VIRTUAL VISIT FOR:  Dennis [name]  By participating in this virtual visit I agree to the following:  I hereby voluntarily request, consent and authorize [facility] and its employed or contracted physicians, physician assistants, nurse practitioners or other licensed health care professionals (the Practitioner), to provide me with telemedicine health care services (the "Services) as deemed necessary by the treating Practitioner. I acknowledge and consent to receive the Services by the Practitioner via telemedicine. I understand that the telemedicine visit will involve communicating with the Practitioner through live audiovisual communication technology and the disclosure of certain medical information by electronic transmission. I acknowledge that I have been given the opportunity to request an in-person assessment or other available alternative prior to the telemedicine visit and am voluntarily participating in the telemedicine visit.  I understand that I have the right to withhold or withdraw my consent to the use of telemedicine in the course of my care at any time, without affecting my right to future care or treatment, and that the Practitioner or I may terminate the telemedicine visit at any time. I understand that I have the right to inspect all information obtained and/or recorded in the course of the telemedicine visit and may receive copies of available information for a reasonable fee.  I understand that some of the potential risks of receiving the Services via telemedicine include:  Delay or interruption in medical evaluation due to technological equipment failure or disruption; Information transmitted may not be sufficient (e.g. poor resolution of images) to allow for appropriate medical  decision making by the Practitioner; and/or  In rare instances, security protocols could fail, causing a breach of personal health information.  Furthermore, I acknowledge that it is my responsibility to provide information about my medical history, conditions and care that is complete and accurate to the best of my ability. I acknowledge that Practitioner's advice, recommendations, and/or decision may be based on factors not within their control, such as incomplete or inaccurate data provided by me or distortions of diagnostic images or specimens that may result from electronic transmissions. I understand that the practice of medicine is not an exact science and that Practitioner makes no warranties or guarantees regarding treatment outcomes. I acknowledge that a copy of this consent can be made available to me via my patient portal [facility] MyChart), or I can request a printed copy by calling the office of [facility].    I understand that my insurance will be billed for this visit.   I have read or had this consent read to me. I understand the contents of this consent, which adequately explains the benefits and risks of the Services being provided via telemedicine.  I have been provided ample opportunity to ask questions regarding this consent and the Services and have had my questions answered to my satisfaction. I give my informed consent for the services to be provided through the use of telemedicine in my medical care

## 2024-06-24 NOTE — Telephone Encounter (Signed)
 Spoke with pt and scheduled TELE Preop appt 06/25/24 approved by JC  Med Rec and Consent Done

## 2024-06-25 ENCOUNTER — Ambulatory Visit: Attending: Cardiology | Admitting: Student

## 2024-06-25 DIAGNOSIS — Z01810 Encounter for preprocedural cardiovascular examination: Secondary | ICD-10-CM | POA: Diagnosis not present

## 2024-06-25 NOTE — Progress Notes (Signed)
 Virtual Visit via Telephone Note   Because of Dennis [name]'s co-morbid illnesses, he is at least at moderate risk for complications without adequate follow up.  This format is felt to be most appropriate for this patient at this time.  The patient did not have access to video technology/had technical difficulties with video requiring transitioning to audio format only (telephone).  All issues noted in this document were discussed and addressed.  No physical exam could be performed with this format.  Please refer to the patient's chart for his consent to telehealth for [facility].  Evaluation Performed:  Preoperative cardiovascular risk assessment _____________   Date:  06/25/2024   Patient ID:  Dennis [name], DOB [birth year], MRN [id number] Patient Location:  Home Provider location:   Office  Primary Care Provider:  Dottie Norleen PHEBE PONCE, MD Primary Cardiologist:  Lamar Fitch, MD  Chief Complaint / Patient Profile   58 y.o. y/o male with a h/o PAF s/p ablation September 2022 on not anticoagulation, asthma, OSA on BiPAP, palpitations/SVT hyperlipidemia who is pending umbilical hernia repair with mesh by Dr. Joesph on 06/29/2024 and presents today for telephonic preoperative cardiovascular risk assessment.  History of Present Illness    Dennis [name] is a 59 y.o. male who presents via audio/video conferencing for a telehealth visit today.  Pt was last seen in cardiology clinic on 11/14/2023 by Delon Hoover, NP.  At that time Dennis [name] was stable from a cardiac standpoint.  The patient is now pending procedure as outlined above. Since his last visit, he is doing well. Patient denies shortness of breath, dyspnea on exertion, lower extremity edema, orthopnea or PND. No chest pain, pressure, or tightness. No palpitations.  He is not experiencing lightheadedness, dizziness, presyncope or syncope. He is active walking 3 miles at least once a week and performing yard work and  other household activities.   Past Medical History    Past Medical History:  Diagnosis Date   Asthma    BPH (benign prostatic hyperplasia)    f/by [facility]   Dyslipidemia 08/08/2022   History of kidney stones    Obesity    Obstructive sleep apnea 05/30/2021   Stable on Bipap   Paroxysmal atrial fibrillation (HCC) 04/27/2021   (history of) f/by Dr. Fitch   Peripheral neuropathy    f/by [facility]   S/P ablation of atrial fibrillation 09/26/2021   Past Surgical History:  Procedure Laterality Date   ATRIAL FIBRILLATION ABLATION N/A 07/13/2021   Procedure: ATRIAL FIBRILLATION ABLATION;  Surgeon: Inocencio Soyla Lunger, MD;  Location: [facility] INVASIVE CV LAB;  Service: Cardiovascular;  Laterality: N/A;   CARDIOVERSION N/A 06/19/2021   Procedure: CARDIOVERSION;  Surgeon: Mona Vinie BROCKS, MD;  Location: [facility] ENDOSCOPY;  Service: Cardiovascular;  Laterality: N/A;   COLONOSCOPY N/A    circa 2024 per Dr. Larene   EXTRACORPOREAL SHOCK WAVE LITHOTRIPSY Left 03/28/2017   Procedure: LEFT EXTRACORPOREAL SHOCK WAVE LITHOTRIPSY (ESWL);  Surgeon: Matilda Senior, MD;  Location: [facility] ORS;  Service: Urology;  Laterality: Left;   LITHOTRIPSY     STENT PLACE LEFT URETER ([facility] HX) Bilateral     Allergies  No Known Allergies  Home Medications    Prior to Admission medications   Medication Sig Start Date End Date Taking? Authorizing Provider  cetirizine  (ZYRTEC ) 10 MG tablet Take 1 tablet (10 mg total) by mouth daily. 06/04/21   Lavell Lye A, FNP  diltiazem  (CARDIZEM  CD) 120 MG 24 hr capsule TAKE 1  CAPSULE BY MOUTH EVERY DAY 05/07/24   Carlin Delon BROCKS, NP  fluticasone  (FLONASE ) 50 MCG/ACT nasal spray SPRAY 2 SPRAYS INTO EACH NOSTRIL EVERY DAY 03/20/22   Lavell Lye A, FNP  pravastatin  (PRAVACHOL ) 20 MG tablet TAKE 1 TABLET BY MOUTH EVERY DAY 05/25/24   Krasowski, Robert J, MD  promethazine -dextromethorphan (PROMETHAZINE -DM) 6.25-15 MG/5ML syrup Take 5 mLs by mouth 4 (four)  times daily as needed for cough. Do not use and drive - May make drowsy. 06/14/24   Ival Domino, FNP  tamsulosin  (FLOMAX ) 0.4 MG CAPS capsule Take 0.4 mg by mouth daily. 01/19/23   [provider]    Physical Exam    Vital Signs:  Dennis [name] does not have vital signs available for review today.  Given telephonic nature of communication, physical exam is limited. AAOx3. NAD. Normal affect.  Speech and respirations are unlabored.   Assessment & Plan    Primary Cardiologist: Lamar Fitch, MD  Preoperative cardiovascular risk assessment.  Umbilical hernia repair with mesh by Dr. Joesph on 06/29/2024  Chart reviewed as part of pre-operative protocol coverage. According to the RCRI, patient has a 0.4% risk of MACE. Patient reports activity equivalent to 4.0 METS (walks 3 miles at least once a week, performs yard work).   Given past medical history and time since last visit, based on ACC/AHA guidelines, Dennis [name] would be at acceptable risk for the planned procedure without further cardiovascular testing.   Patient was advised that if he develops new symptoms prior to surgery to contact our office to arrange a follow-up appointment.  he verbalized understanding.  I will route this recommendation to the requesting party via Epic fax function.  Please call with questions.  Time:   Today, I have spent 5 minutes with the patient with telehealth technology discussing medical history, symptoms, and management plan.     Barnie Hila, NP  06/25/2024, 9:05 AM

## 2024-06-29 DIAGNOSIS — J45909 Unspecified asthma, uncomplicated: Secondary | ICD-10-CM | POA: Diagnosis not present

## 2024-06-29 DIAGNOSIS — Z79899 Other long term (current) drug therapy: Secondary | ICD-10-CM | POA: Diagnosis not present

## 2024-06-29 DIAGNOSIS — Z87891 Personal history of nicotine dependence: Secondary | ICD-10-CM | POA: Diagnosis not present

## 2024-06-29 DIAGNOSIS — E669 Obesity, unspecified: Secondary | ICD-10-CM | POA: Diagnosis not present

## 2024-06-29 DIAGNOSIS — I480 Paroxysmal atrial fibrillation: Secondary | ICD-10-CM | POA: Diagnosis not present

## 2024-06-29 DIAGNOSIS — E785 Hyperlipidemia, unspecified: Secondary | ICD-10-CM | POA: Diagnosis not present

## 2024-06-29 DIAGNOSIS — Z6833 Body mass index (BMI) 33.0-33.9, adult: Secondary | ICD-10-CM | POA: Diagnosis not present

## 2024-06-29 DIAGNOSIS — I471 Supraventricular tachycardia, unspecified: Secondary | ICD-10-CM | POA: Diagnosis not present

## 2024-06-29 DIAGNOSIS — G4733 Obstructive sleep apnea (adult) (pediatric): Secondary | ICD-10-CM | POA: Diagnosis not present

## 2024-06-29 DIAGNOSIS — J45990 Exercise induced bronchospasm: Secondary | ICD-10-CM | POA: Diagnosis not present

## 2024-06-29 DIAGNOSIS — G629 Polyneuropathy, unspecified: Secondary | ICD-10-CM | POA: Diagnosis not present

## 2024-06-29 DIAGNOSIS — N400 Enlarged prostate without lower urinary tract symptoms: Secondary | ICD-10-CM | POA: Diagnosis not present

## 2024-06-29 DIAGNOSIS — K429 Umbilical hernia without obstruction or gangrene: Secondary | ICD-10-CM | POA: Diagnosis not present

## 2024-08-20 ENCOUNTER — Other Ambulatory Visit: Payer: Self-pay | Admitting: Urology

## 2024-09-03 DIAGNOSIS — R3912 Poor urinary stream: Secondary | ICD-10-CM | POA: Diagnosis not present

## 2024-09-03 DIAGNOSIS — N401 Enlarged prostate with lower urinary tract symptoms: Secondary | ICD-10-CM | POA: Diagnosis not present

## 2024-09-03 NOTE — Progress Notes (Addendum)
 Date of COVID positive in last 90 days:  No  PCP - Norleen Fearing, MD Cardiologist - Soyla Norton, MD  Chest x-ray - N/A EKG - 09-08-24 Epic Stress Test - Yes ECHO - 04-23-21 Epic Cardiac Cath - N/A Long Term Monitor - 04-17-23 Epic Afib abltaion - 07-13-21 Epic Cardiac CT - 07-06-21 Epic Pacemaker/ICD device last checked:N/A Spinal Cord Stimulator:N/A  Bowel Prep - N/A  Sleep Study - Yes, +sleep apnea BiPAP - yes  Fasting Blood Sugar - N/A Checks Blood Sugar _____ times a day  Last dose of GLP1 agonist-  N/A GLP1 instructions:  Do not take after     Last dose of SGLT-2 inhibitors-  N/A SGLT-2 instructions:  Do not take after    Blood Thinner Instructions: N/A  Aspirin Instructions:N/A Last Dose:  Activity level:  Can go up a flight of stairs and perform activities of daily living without stopping and without symptoms of chest pain or shortness of breath.   Able to exercise without symptoms  Anesthesia review: Afib, SVT, OSA  Patient denies shortness of breath, fever, cough and chest pain at PAT appointment  Patient verbalized understanding of instructions that were given to them at the PAT appointment. Patient was also instructed that they will need to review over the PAT instructions again at home before surgery.

## 2024-09-03 NOTE — Patient Instructions (Addendum)
 SURGICAL WAITING ROOM VISITATION Patients having surgery or a procedure may have no more than 2 support people in the waiting area - these visitors may rotate.    Children under the age of 46 must have an adult with them who is not the patient.  If the patient needs to stay at the hospital during part of their recovery, the visitor guidelines for inpatient rooms apply. Pre-op nurse will coordinate an appropriate time for 1 support person to accompany patient in pre-op.  This support person may not rotate.    Please refer to the [facility] website for the visitor guidelines for Inpatients (after your surgery is over and you are in a regular room).       Your procedure is scheduled on: 09-15-24   Report to [facility] Main Entrance    Report to admitting at 7:15 AM   Call this number if you have problems the morning of surgery [phone]   Do not eat food or drink liquids :After Midnight.           If you have questions, please contact your surgeon's office.  FOLLOW BOWEL PREP AND ANY ADDITIONAL PRE OP INSTRUCTIONS YOU RECEIVED FROM YOUR SURGEON'S OFFICE!!!     Oral Hygiene is also important to reduce your risk of infection.                                    Remember - BRUSH YOUR TEETH THE MORNING OF SURGERY WITH YOUR REGULAR TOOTHPASTE   Do NOT smoke after Midnight   Take these medicines the morning of surgery with A SIP OF WATER:    Diltiazem    Pravastatin    Tamsulosin     Stop all vitamins and herbal supplements 7 days before surgery  Bring BiPAP mask and tubing day of surgery.                              You may not have any metal on your body including jewelry, and body piercing             Do not wear  lotions, powders, cologne, or deodorant              Men may shave face and neck.   Do not bring valuables to the hospital. [facility] IS NOT RESPONSIBLE   FOR VALUABLES.   Contacts, dentures or bridgework may not be worn into surgery.  DO NOT BRING  YOUR HOME MEDICATIONS TO THE HOSPITAL. PHARMACY WILL DISPENSE MEDICATIONS LISTED ON YOUR MEDICATION LIST TO YOU DURING YOUR ADMISSION IN THE HOSPITAL!    Patients discharged on the day of surgery will not be allowed to drive home.  Someone NEEDS to stay with you for the first 24 hours after anesthesia.   Special Instructions: Bring a copy of your healthcare power of attorney and living will documents the day of surgery if you haven't scanned them before.              Please read over the following fact sheets you were given: IF YOU HAVE QUESTIONS ABOUT YOUR PRE-OP INSTRUCTIONS PLEASE CALL [phone] [facility]  If you received a COVID test during your pre-op visit  it is requested that you wear a mask when out in public, stay away from anyone that may not be feeling well and notify your surgeon if you develop  symptoms. If you test positive for Covid or have been in contact with anyone that has tested positive in the last 10 days please notify you surgeon.  [place] - Preparing for Surgery Before surgery, you can play an important role.  Because skin is not sterile, your skin needs to be as free of germs as possible.  You can reduce the number of germs on your skin by washing with CHG (chlorahexidine gluconate) soap before surgery.  CHG is an antiseptic cleaner which kills germs and bonds with the skin to continue killing germs even after washing. Please DO NOT use if you have an allergy to CHG or antibacterial soaps.  If your skin becomes reddened/irritated stop using the CHG and inform your nurse when you arrive at Short Stay. Do not shave (including legs and underarms) for at least 48 hours prior to the first CHG shower.  You may shave your face/neck.  Please follow these instructions carefully:  1.  Shower with CHG Soap the night before surgery and the  morning of surgery.  2.  If you choose to wash your hair, wash your hair first as usual with your normal  shampoo.  3.  After you shampoo, rinse  your hair and body thoroughly to remove the shampoo.                             4.  Use CHG as you would any other liquid soap.  You can apply chg directly to the skin and wash.  Gently with a scrungie or clean washcloth.  5.  Apply the CHG Soap to your body ONLY FROM THE NECK DOWN.   Do   not use on face/ open                           Wound or open sores. Avoid contact with eyes, ears mouth and   genitals (private parts).                       Wash face,  Genitals (private parts) with your normal soap.             6.  Wash thoroughly, paying special attention to the area where your    surgery  will be performed.  7.  Thoroughly rinse your body with warm water from the neck down.  8.  DO NOT shower/wash with your normal soap after using and rinsing off the CHG Soap.                9.  Pat yourself dry with a clean towel.            10.  Wear clean pajamas.            11.  Place clean sheets on your bed the night of your first shower and do not  sleep with pets. Day of Surgery : Do not apply any lotions/deodorants the morning of surgery.  Please wear clean clothes to the hospital/surgery center.  FAILURE TO FOLLOW THESE INSTRUCTIONS MAY RESULT IN THE CANCELLATION OF YOUR SURGERY  PATIENT SIGNATURE_________________________________  NURSE SIGNATURE__________________________________  ________________________________________________________________________

## 2024-09-08 ENCOUNTER — Encounter (HOSPITAL_COMMUNITY)
Admission: RE | Admit: 2024-09-08 | Discharge: 2024-09-08 | Disposition: A | Discharge status: Home / Self Care | Source: Ambulatory Visit | Attending: Urology | Admitting: Urology

## 2024-09-08 ENCOUNTER — Other Ambulatory Visit: Payer: Self-pay

## 2024-09-08 ENCOUNTER — Encounter (HOSPITAL_COMMUNITY): Payer: Self-pay

## 2024-09-08 VITALS — BP 124/91 | HR 70 | Temp 98.0°F | Resp 16 | Ht 74.0 in | Wt 248.4 lb

## 2024-09-08 DIAGNOSIS — J45909 Unspecified asthma, uncomplicated: Secondary | ICD-10-CM | POA: Diagnosis not present

## 2024-09-08 DIAGNOSIS — I480 Paroxysmal atrial fibrillation: Secondary | ICD-10-CM | POA: Insufficient documentation

## 2024-09-08 DIAGNOSIS — N401 Enlarged prostate with lower urinary tract symptoms: Secondary | ICD-10-CM | POA: Insufficient documentation

## 2024-09-08 DIAGNOSIS — N138 Other obstructive and reflux uropathy: Secondary | ICD-10-CM | POA: Insufficient documentation

## 2024-09-08 DIAGNOSIS — Z01818 Encounter for other preprocedural examination: Secondary | ICD-10-CM | POA: Insufficient documentation

## 2024-09-08 DIAGNOSIS — G4733 Obstructive sleep apnea (adult) (pediatric): Secondary | ICD-10-CM | POA: Insufficient documentation

## 2024-09-08 DIAGNOSIS — F17210 Nicotine dependence, cigarettes, uncomplicated: Secondary | ICD-10-CM | POA: Diagnosis not present

## 2024-09-08 DIAGNOSIS — I2510 Atherosclerotic heart disease of native coronary artery without angina pectoris: Secondary | ICD-10-CM | POA: Insufficient documentation

## 2024-09-08 HISTORY — DX: Unspecified atrial fibrillation: I48.91

## 2024-09-08 LAB — BASIC METABOLIC PANEL WITH GFR
Anion gap: 10 (ref 5–15)
BUN: 13 mg/dL (ref 6–20)
CO2: 27 mmol/L (ref 22–32)
Calcium: 9.3 mg/dL (ref 8.9–10.3)
Chloride: 102 mmol/L (ref 98–111)
Creatinine, Ser: 0.98 mg/dL (ref 0.61–1.24)
GFR, Estimated: >60 mL/min (ref >60)
Glucose, Bld: 109 mg/dL — ABNORMAL HIGH (ref 70–99)
Potassium: 4.1 mmol/L (ref 3.5–5.1)
Sodium: 139 mmol/L (ref 135–145)

## 2024-09-08 LAB — CBC
HCT: 48.1 % (ref 39.0–52.0)
Hemoglobin: 16.4 g/dL (ref 13.0–17.0)
MCH: 32.5 pg (ref 26.0–34.0)
MCHC: 34.1 g/dL (ref 30.0–36.0)
MCV: 95.2 fL (ref 80.0–100.0)
Platelets: 263 K/uL (ref 150–400)
RBC: 5.05 MIL/uL (ref 4.22–5.81)
RDW: 13 % (ref 11.5–15.5)
WBC: 9 K/uL (ref 4.0–10.5)
nRBC: 0 % (ref 0.0–0.2)

## 2024-09-09 NOTE — Progress Notes (Signed)
 Anesthesia Chart Review   Case: [id number] Date/Time: 09/15/24 0917   Procedure: ABLATION, PROSTATE, TRANSURETHRAL, USING WATERJET   Anesthesia type: General   Diagnosis: Enlarged prostate with urinary obstruction [N40.1, N13.8]   Pre-op diagnosis: BENIGN PROSTTIC HYPERPLASIA   [facility] ROOM 03 / [facility]   Surgeons: Nieves Cough, MD       DISCUSSION:smoker with h/o asthma, OSA on BiPAP, PAF s/p ablation 2022 not on anticoagulation, BPH scheduled for above procedure 09/15/24 with Dr. Cough Nieves.   Per cardiology preoperative evaluation 06/25/2024, Chart reviewed as part of pre-operative protocol coverage. According to the RCRI, patient has a 0.4% risk of MACE. Patient reports activity equivalent to 4.0 METS (walks 3 miles at least once a week, performs yard work).    Given past medical history and time since last visit, based on ACC/AHA guidelines, IMAD SHOSTAK would be at acceptable risk for the planned procedure without further cardiovascular testing.   VS: BP (!) 124/91   Pulse 70   Temp 36.7 C (Oral)   Resp 16   Ht 6' 2 (1.88 m)   Wt 112.7 kg   SpO2 95%   BMI 31.89 kg/m   PROVIDERS: Dottie Norleen PHEBE PONCE, MD is PCP   Cardiologist - Soyla Norton, MD  LABS: Labs reviewed: Acceptable for surgery. (all labs ordered are listed, but only abnormal results are displayed)  Labs Reviewed  BASIC METABOLIC PANEL WITH GFR - Abnormal; Notable for the following components:      Result Value   Glucose, Bld 109 (*)    All other components within normal limits  CBC     IMAGES:   EKG:   CV:  Past Medical History:  Diagnosis Date   A-fib (HCC)    Asthma    BPH (benign prostatic hyperplasia)    f/by [facility]   Dyslipidemia 08/08/2022   History of kidney stones    Obesity    Obstructive sleep apnea 05/30/2021   Stable on Bipap   Paroxysmal atrial fibrillation (HCC) 04/27/2021   (history of) f/by Dr. Bernie   Peripheral neuropathy    f/by  [facility]   S/P ablation of atrial fibrillation 09/26/2021    Past Surgical History:  Procedure Laterality Date   ATRIAL FIBRILLATION ABLATION N/A 07/13/2021   Procedure: ATRIAL FIBRILLATION ABLATION;  Surgeon: Norton Soyla Lunger, MD;  Location: [facility] INVASIVE CV LAB;  Service: Cardiovascular;  Laterality: N/A;   CARDIOVERSION N/A 06/19/2021   Procedure: CARDIOVERSION;  Surgeon: Mona Vinie BROCKS, MD;  Location: [facility] ENDOSCOPY;  Service: Cardiovascular;  Laterality: N/A;   COLONOSCOPY N/A    circa 2024 per Dr. Larene   EXTRACORPOREAL SHOCK WAVE LITHOTRIPSY Left 03/28/2017   Procedure: LEFT EXTRACORPOREAL SHOCK WAVE LITHOTRIPSY (ESWL);  Surgeon: Matilda Senior, MD;  Location: [facility]S;  Service: Urology;  Laterality: Left;   LITHOTRIPSY     STENT PLACE LEFT URETER ([facility] HX) Bilateral     MEDICATIONS:  cetirizine  (ZYRTEC ) 10 MG tablet   cyanocobalamin  (VITAMIN B12) 500 MCG tablet   diltiazem  (CARDIZEM  CD) 120 MG 24 hr capsule   fluticasone  (FLONASE ) 50 MCG/ACT nasal spray   loratadine (CLARITIN) 10 MG tablet   pravastatin  (PRAVACHOL ) 20 MG tablet   Probiotic Product (ALIGN PO)   promethazine -dextromethorphan (PROMETHAZINE -DM) 6.25-15 MG/5ML syrup   tamsulosin  (FLOMAX ) 0.4 MG CAPS capsule   No current facility-administered medications for this encounter.     Harlene Hoots Ward, PA-C [facility] Pre-Surgical Testing [phone]

## 2024-09-09 NOTE — Anesthesia Preprocedure Evaluation (Addendum)
 Anesthesia Evaluation  Patient identified by MRN, date of birth, ID band Patient awake    Reviewed: Allergy & Precautions, NPO status , Patient's Chart, lab work & pertinent test results  History of Anesthesia Complications Negative for: history of anesthetic complications  Airway Mallampati: I  TM Distance: >3 FB Neck ROM: Full    Dental  (+) Teeth Intact, Dental Advisory Given   Pulmonary asthma , sleep apnea (BiPAP) , Current Smoker and Patient abstained from smoking.   breath sounds clear to auscultation       Cardiovascular + dysrhythmias (s/p Ablation) Atrial Fibrillation  Rhythm:Regular Rate:Normal     Neuro/Psych    GI/Hepatic   Endo/Other  neg diabetes    Renal/GU Renal disease   BPH    Musculoskeletal   Abdominal   Peds  Hematology Hgb 16.4, Plts 263K (09/08/24)   Anesthesia Other Findings   Reproductive/Obstetrics                              Anesthesia Physical Anesthesia Plan  ASA: 2  Anesthesia Plan: General   Post-op Pain Management:    Induction: Intravenous  PONV Risk Score and Plan: 1 and Ondansetron , Dexamethasone  and Treatment may vary due to age or medical condition  Airway Management Planned: Oral ETT  Additional Equipment: None  Intra-op Plan:   Post-operative Plan: Extubation in OR  Informed Consent:      Dental advisory given  Plan Discussed with: CRNA  Anesthesia Plan Comments: (See PAT note 09/08/2024)         Anesthesia Quick Evaluation

## 2024-09-15 ENCOUNTER — Other Ambulatory Visit: Payer: Self-pay

## 2024-09-15 ENCOUNTER — Encounter (HOSPITAL_COMMUNITY): Payer: Self-pay | Admitting: Urology

## 2024-09-15 ENCOUNTER — Ambulatory Visit (HOSPITAL_COMMUNITY): Admitting: Registered Nurse

## 2024-09-15 ENCOUNTER — Encounter (HOSPITAL_COMMUNITY)
Admission: RE | Disposition: A | Discharge status: Home / Self Care | Payer: Self-pay | Source: Ambulatory Visit | Attending: Urology

## 2024-09-15 ENCOUNTER — Ambulatory Visit (HOSPITAL_COMMUNITY): Payer: Self-pay | Admitting: Physician Assistant

## 2024-09-15 ENCOUNTER — Ambulatory Visit (HOSPITAL_COMMUNITY)
Admission: RE | Admit: 2024-09-15 | Discharge: 2024-09-15 | Disposition: A | Discharge status: Home / Self Care | Source: Ambulatory Visit | Attending: Urology | Admitting: Urology

## 2024-09-15 DIAGNOSIS — F17290 Nicotine dependence, other tobacco product, uncomplicated: Secondary | ICD-10-CM | POA: Diagnosis not present

## 2024-09-15 DIAGNOSIS — R3912 Poor urinary stream: Secondary | ICD-10-CM | POA: Insufficient documentation

## 2024-09-15 DIAGNOSIS — N138 Other obstructive and reflux uropathy: Secondary | ICD-10-CM | POA: Diagnosis not present

## 2024-09-15 DIAGNOSIS — N289 Disorder of kidney and ureter, unspecified: Secondary | ICD-10-CM | POA: Insufficient documentation

## 2024-09-15 DIAGNOSIS — N401 Enlarged prostate with lower urinary tract symptoms: Secondary | ICD-10-CM | POA: Diagnosis not present

## 2024-09-15 DIAGNOSIS — G4733 Obstructive sleep apnea (adult) (pediatric): Secondary | ICD-10-CM | POA: Diagnosis not present

## 2024-09-15 DIAGNOSIS — N400 Enlarged prostate without lower urinary tract symptoms: Secondary | ICD-10-CM | POA: Diagnosis not present

## 2024-09-15 DIAGNOSIS — Z8249 Family history of ischemic heart disease and other diseases of the circulatory system: Secondary | ICD-10-CM | POA: Insufficient documentation

## 2024-09-15 DIAGNOSIS — R350 Frequency of micturition: Secondary | ICD-10-CM | POA: Diagnosis not present

## 2024-09-15 DIAGNOSIS — J45909 Unspecified asthma, uncomplicated: Secondary | ICD-10-CM | POA: Diagnosis not present

## 2024-09-15 DIAGNOSIS — I480 Paroxysmal atrial fibrillation: Secondary | ICD-10-CM | POA: Diagnosis not present

## 2024-09-15 SURGERY — ABLATION, PROSTATE, TRANSURETHRAL, USING WATERJET
Anesthesia: General

## 2024-09-15 MED ORDER — ACETAMINOPHEN 10 MG/ML IV SOLN: 1000.0000 mg | Freq: Once | INTRAVENOUS | Status: DC | PRN

## 2024-09-15 MED ORDER — MIDAZOLAM HCL 5 MG/5ML IJ SOLN
INTRAMUSCULAR | Status: DC | PRN
  Administered 2024-09-15: 2 mg via INTRAVENOUS

## 2024-09-15 MED ORDER — OXYCODONE HCL 5 MG PO TABS
5.0000 mg | ORAL_TABLET | Freq: Once | ORAL | Status: AC | PRN
  Administered 2024-09-15: 5 mg via ORAL

## 2024-09-15 MED ORDER — ONDANSETRON HCL 4 MG/2ML IJ SOLN: 4.0000 mg | Freq: Once | INTRAMUSCULAR | Status: DC | PRN

## 2024-09-15 MED ORDER — NITROFURANTOIN MONOHYD MACRO 100 MG PO CAPS: 100.0000 mg | ORAL_CAPSULE | Freq: Every day | ORAL | 0 refills | Status: AC

## 2024-09-15 MED ORDER — LIDOCAINE HCL (PF) 2 % IJ SOLN
INTRAMUSCULAR | Status: DC | PRN
  Administered 2024-09-15: 60 mg via INTRADERMAL

## 2024-09-15 MED ORDER — PROPOFOL 10 MG/ML IV BOLUS
INTRAVENOUS | Status: AC
  Filled 2024-09-15: qty 20

## 2024-09-15 MED ORDER — SODIUM CHLORIDE 0.9 % IR SOLN
Status: DC | PRN
  Administered 2024-09-15: 15000 mL

## 2024-09-15 MED ORDER — ROCURONIUM BROMIDE 10 MG/ML (PF) SYRINGE
PREFILLED_SYRINGE | INTRAVENOUS | Status: DC | PRN
  Administered 2024-09-15: 50 mg via INTRAVENOUS
  Administered 2024-09-15: 20 mg via INTRAVENOUS

## 2024-09-15 MED ORDER — FENTANYL CITRATE (PF) 50 MCG/ML IJ SOSY
25.0000 ug | PREFILLED_SYRINGE | INTRAMUSCULAR | Status: DC | PRN
  Administered 2024-09-15 (×3): 50 ug via INTRAVENOUS

## 2024-09-15 MED ORDER — LIDOCAINE HCL (PF) 2 % IJ SOLN
INTRAMUSCULAR | Status: AC
  Filled 2024-09-15: qty 5

## 2024-09-15 MED ORDER — MIDAZOLAM HCL 2 MG/2ML IJ SOLN
INTRAMUSCULAR | Status: AC
  Filled 2024-09-15: qty 2

## 2024-09-15 MED ORDER — FENTANYL CITRATE (PF) 100 MCG/2ML IJ SOLN
INTRAMUSCULAR | Status: DC | PRN
  Administered 2024-09-15: 25 ug via INTRAVENOUS
  Administered 2024-09-15 (×2): 50 ug via INTRAVENOUS
  Administered 2024-09-15: 25 ug via INTRAVENOUS
  Administered 2024-09-15: 50 ug via INTRAVENOUS

## 2024-09-15 MED ORDER — PROPOFOL 10 MG/ML IV BOLUS
INTRAVENOUS | Status: DC | PRN
  Administered 2024-09-15: 200 mg via INTRAVENOUS

## 2024-09-15 MED ORDER — LACTATED RINGERS IV SOLN: INTRAVENOUS | Status: DC

## 2024-09-15 MED ORDER — TRANEXAMIC ACID-NACL 1000-0.7 MG/100ML-% IV SOLN
1000.0000 mg | INTRAVENOUS | Status: AC
  Administered 2024-09-15: 1000 mg via INTRAVENOUS
  Filled 2024-09-15: qty 100

## 2024-09-15 MED ORDER — OXYCODONE HCL 5 MG/5ML PO SOLN: 5.0000 mg | Freq: Once | ORAL | Status: AC | PRN

## 2024-09-15 MED ORDER — OXYCODONE HCL 5 MG PO TABS
ORAL_TABLET | ORAL | Status: AC
  Filled 2024-09-15: qty 1

## 2024-09-15 MED ORDER — FENTANYL CITRATE (PF) 100 MCG/2ML IJ SOLN
INTRAMUSCULAR | Status: AC
  Filled 2024-09-15: qty 2

## 2024-09-15 MED ORDER — FENTANYL CITRATE (PF) 50 MCG/ML IJ SOSY
PREFILLED_SYRINGE | INTRAMUSCULAR | Status: AC
  Filled 2024-09-15: qty 3

## 2024-09-15 MED ORDER — CHLORHEXIDINE GLUCONATE 0.12 % MT SOLN
15.0000 mL | Freq: Once | OROMUCOSAL | Status: AC
Start: 2024-09-15 — End: 2024-09-15
  Administered 2024-09-15: 15 mL via OROMUCOSAL

## 2024-09-15 MED ORDER — SUGAMMADEX SODIUM 200 MG/2ML IV SOLN
INTRAVENOUS | Status: AC
  Filled 2024-09-15: qty 2

## 2024-09-15 MED ORDER — ONDANSETRON HCL 4 MG/2ML IJ SOLN
INTRAMUSCULAR | Status: AC
  Filled 2024-09-15: qty 2

## 2024-09-15 MED ORDER — SODIUM CHLORIDE 0.9 % IV SOLN
2.0000 g | INTRAVENOUS | Status: AC
  Administered 2024-09-15: 2 g via INTRAVENOUS
  Filled 2024-09-15: qty 20

## 2024-09-15 MED ORDER — ORAL CARE MOUTH RINSE: 15.0000 mL | Freq: Once | OROMUCOSAL | Status: AC

## 2024-09-15 MED ORDER — AMISULPRIDE (ANTIEMETIC) 5 MG/2ML IV SOLN: 10.0000 mg | Freq: Once | INTRAVENOUS | Status: DC | PRN

## 2024-09-15 MED ORDER — STERILE WATER FOR IRRIGATION IR SOLN
Status: DC | PRN
Start: 2024-09-15 — End: 2024-09-15
  Administered 2024-09-15: 500 mL

## 2024-09-15 MED ORDER — ROCURONIUM BROMIDE 10 MG/ML (PF) SYRINGE
PREFILLED_SYRINGE | INTRAVENOUS | Status: AC
  Filled 2024-09-15: qty 10

## 2024-09-15 MED ORDER — DEXAMETHASONE SOD PHOSPHATE PF 10 MG/ML IJ SOLN
INTRAMUSCULAR | Status: DC | PRN
  Administered 2024-09-15: 10 mg via INTRAVENOUS

## 2024-09-15 MED ORDER — SUGAMMADEX SODIUM 200 MG/2ML IV SOLN
INTRAVENOUS | Status: DC | PRN
  Administered 2024-09-15: 200 mg via INTRAVENOUS

## 2024-09-15 SURGICAL SUPPLY — 27 items
BAG URINE DRAIN 2000ML AR STRL (UROLOGICAL SUPPLIES) ×1 IMPLANT
BAND RUBBER #18 3X1/16 STRL (MISCELLANEOUS) IMPLANT
CATH HEMA 3WAY 30CC 22FR COUDE (CATHETERS) IMPLANT
CATH HEMA 3WAY 30CC 24FR COUDE (CATHETERS) IMPLANT
CATH HEMATURIA 20FR (CATHETERS) IMPLANT
COVER MAYO STAND STRL (DRAPES) ×1 IMPLANT
DRAPE FOOT SWITCH (DRAPES) ×1 IMPLANT
DRAPE SURG IRRIG POUCH 19X23 (DRAPES) IMPLANT
GEL ULTRASOUND 8.5O AQUASONIC (MISCELLANEOUS) ×1 IMPLANT
GLOVE SURG LX STRL 7.5 STRW (GLOVE) ×1 IMPLANT
GOWN STRL REUS W/ TWL XL LVL3 (GOWN DISPOSABLE) ×1 IMPLANT
HANDPIECE AQUABEAM (MISCELLANEOUS) ×1 IMPLANT
HOLDER FOLEY CATH W/STRAP (MISCELLANEOUS) IMPLANT
KIT TURNOVER KIT A (KITS) ×1 IMPLANT
LOOP CUT BIPOLAR 24F LRG (ELECTROSURGICAL) IMPLANT
MANIFOLD NEPTUNE II (INSTRUMENTS) ×1 IMPLANT
MAT ABSORB FLUID 56X50 GRAY (MISCELLANEOUS) ×1 IMPLANT
PACK CYSTO (CUSTOM PROCEDURE TRAY) ×1 IMPLANT
PACK DRAPE AQUABEAM (MISCELLANEOUS) ×1 IMPLANT
PAD PREP 24X48 CUFFED NSTRL (MISCELLANEOUS) ×1 IMPLANT
PIN SAFETY STERILE (MISCELLANEOUS) IMPLANT
SYR 30ML LL (SYRINGE) ×1 IMPLANT
SYRINGE TOOMEY IRRIG 70ML (MISCELLANEOUS) ×2 IMPLANT
TOWEL OR DSP ST BLU DLX 10/PK (DISPOSABLE) ×1 IMPLANT
TUBING CONNECTING 10 (TUBING) ×2 IMPLANT
TUBING UROLOGY SET (TUBING) ×1 IMPLANT
UNDERPAD 30X36 HEAVY ABSORB (UNDERPADS AND DIAPERS) ×1 IMPLANT

## 2024-09-15 NOTE — Anesthesia Postprocedure Evaluation (Signed)
 Anesthesia Post Note  Patient: Dennis [name]  Procedure(s) Performed: ABLATION, PROSTATE, TRANSURETHRAL, USING WATERJET     Patient location during evaluation: PACU Anesthesia Type: General Level of consciousness: awake Pain management: pain level controlled Vital Signs Assessment: post-procedure vital signs reviewed and stable Respiratory status: spontaneous breathing Cardiovascular status: blood pressure returned to baseline Postop Assessment: no apparent nausea or vomiting Anesthetic complications: no   No notable events documented.  Last Vitals:  Vitals:   09/15/24 1330 09/15/24 1345  BP: 110/75 115/85  Pulse: 63 67  Resp: 16 18  Temp:    SpO2: 92% 94%    Last Pain:  Vitals:   09/15/24 1345  TempSrc:   PainSc: 5                  Lauraine KATHEE [place]

## 2024-09-15 NOTE — Transfer of Care (Signed)
 Immediate Anesthesia Transfer of Care Note  Patient: Dennis [name]  Procedure(s) Performed: ABLATION, PROSTATE, TRANSURETHRAL, USING WATERJET  Patient Location: PACU  Anesthesia Type:General  Level of Consciousness: awake, alert , oriented, and patient cooperative  Airway & Oxygen Therapy: Patient Spontanous Breathing and Patient connected to face mask oxygen  Post-op Assessment: Report given to RN, Post -op Vital signs reviewed and stable, and Patient moving all extremities  Post vital signs: Reviewed and stable  Last Vitals:  Vitals Value Taken Time  BP 110/87 09/15/24 11:51  Temp    Pulse 74 09/15/24 11:54  Resp 21 09/15/24 11:54  SpO2 98 % 09/15/24 11:54  Vitals shown include unfiled device data.  Last Pain:  Vitals:   09/15/24 0821  TempSrc:   PainSc: 0-No pain         Complications: No notable events documented.

## 2024-09-15 NOTE — Anesthesia Procedure Notes (Signed)
 Procedure Name: Intubation Date/Time: 09/15/2024 10:15 AM  Performed by: Memory Armida LABOR, CRNAPre-anesthesia Checklist: Patient identified, Emergency Drugs available, Suction available, Patient being monitored and Timeout performed Patient Re-evaluated:Patient Re-evaluated prior to induction Oxygen Delivery Method: Circle system utilized Preoxygenation: Pre-oxygenation with 100% oxygen Induction Type: IV induction Ventilation: Mask ventilation without difficulty Laryngoscope Size: Mac and 4 Grade View: Grade I Tube type: Oral Tube size: 7.5 mm Number of attempts: 1 Airway Equipment and Method: Stylet Placement Confirmation: ETT inserted through vocal cords under direct vision, positive ETCO2 and breath sounds checked- equal and bilateral Secured at: 23 cm Tube secured with: Tape Dental Injury: Teeth and Oropharynx as per pre-operative assessment

## 2024-09-15 NOTE — Op Note (Signed)
 Preoperative diagnosis: BPH with lower urinary tract symptoms, weak stream, frequency  Postoperative diagnosis: Same   Procedure: Robotic water  jet ablation of the prostate   Surgeon: Nieves   Anesthesia: General   Indication for procedure:   Findings:  On EUA prostate 40 g and smooth without hard area or nodule.   Cystoscopy revealed obstructing lateral lobe hypertrophy.   Description of procedure:  He was brought to the operating room and placed supine on the operating table.  After adequate anesthesia he was placed lithotomy position. Timeout was performed to confirm the patient and procedure. The TRUS Stepper was mounted to the Articulating Arm and secured to OR bed. The ultrasound probe was attached to the stepper. Exam under anesthesia was performed and the TRUS was inserted per rectum.  There was no resistance. The ultrasound probe was aligned, and confirmation made that the prostate is centered and aligned using both transverse and sagittal views. The bladder neck, verumontanum and the central/transition zones were identified.  Genitalia were prepped and draped in the usual sterile fashion. The 52F AQUABEAM Handpiece is inserted into the prostatic urethra and a complete cystoscopic evaluation was performed by inspecting the prostate, bladder, and identifying the location of the verumontanum/external sphincter. The AQUABEAM Handpiece was secured to the Handpiece Articulating Arm. Confirmed alignment of AQUABEAM Handpiece and TRUS Probe to be parallel and colinear. Confirmation that AQUABEAM nozzle is centered and anterior of the bladder neck or the median lobe. The cystoscope was then retracted to visualize the verumontanum and external sphincter and the cystoscope tip was positioned just proximal to the external sphincter. Reconfirmed alignment of the TRUS probe with the AQUABEAM Handpiece and compression applied with TRUS probe. Horizontal alignment of the Handpiece waterjet nozzle was  performed. The Aquablation treatment zones were planned utilizing real-time TRUS to visualize the contour of the prostate and the depth and radial angles of resection were defined in the transverse view. In the sagittal view, the AQUABEAM nozzle is identified and position registered with software. The treatment contours were then adjusted to conform to the intended resection margins. The median lobe, bladder neck and verumontanum were marked and confirmed in the treatment contour. The Aquablation Treatment was then started following the resection contour confirmed under ultrasound guidance.  TOTAL AQUABLATION RESECTION TIME: first pass 4:37; second pass 4:18   Once Aquablation resection was complete the 24 French aqua beam handpiece was carefully removed.  The continuous-flow sheath with the visual obturator was passed and then the loop and handle.  The trigone and the ureteral orifices were identified.  The bladder neck was identified at 6:00 and this was taken up to 12:00 with fulguration of the bladder neck and prostate for hemostasis.  Slight amount of right lateral and anterior tissue was resected to connect to the ablated tissue.  Similarly from 6:00 up to 12:00 on the left side of the bladder neck was identified by resecting some of the ablated tissue to identify the bladder neck and cauterize any bleeding.  Some left lateral and anterior tissue on the left was resected as well.  This created excellent hemostasis.  All the chips were evacuated.  Ureteral orifices again identified and noted to be normal without injury.  Verua and apex inspected with noted good anterior channel and excellent hemostasis. The scope was backed out and a 20 French hematuria catheter was placed with 30 cc in the balloon.  The balloon was seated at the bladder neck and it was irrigated on light traction and noted  to be clear to pink.  He was hooked up to CBI.  He was cleaned up and placed supine.  Catheter was placed on traction.   He was awakened and taken to the cover room in stable condition.  Complications: None  Blood loss: 50 mL  Specimens: None  Drains: 20 French three-way hematuria catheter with 30 cc in the balloon  Disposition: Patient stable to PACU

## 2024-09-15 NOTE — H&P (Signed)
 H&P  Chief Complaint: BPH   History of Present Illness: 58 year old male with symptomatic BPH.  IPSS 14, prostate ultrasound 87 g.  No median lobe.  PSA 2.0.  Uroflow 16.2 but a sawtooth pattern and PVR of 335.  He presents today for robotic water  jet ablation of the prostate.  He is well without dysuria or gross hematuria.  No fever or congestion. He wants to preserve.     Past Medical History:  Diagnosis Date   A-fib [facility])    Asthma    BPH (benign prostatic hyperplasia)    f/by [facility]   Dyslipidemia 08/08/2022   History of kidney stones    Obesity    Obstructive sleep apnea 05/30/2021   Stable on Bipap   Paroxysmal atrial fibrillation (HCC) 04/27/2021   (history of) f/by Dr. Bernie   Peripheral neuropathy    f/by [facility]   S/P ablation of atrial fibrillation 09/26/2021   Past Surgical History:  Procedure Laterality Date   ATRIAL FIBRILLATION ABLATION N/A 07/13/2021   Procedure: ATRIAL FIBRILLATION ABLATION;  Surgeon: Inocencio Soyla Lunger, MD;  Location: [facility] INVASIVE CV LAB;  Service: Cardiovascular;  Laterality: N/A;   CARDIOVERSION N/A 06/19/2021   Procedure: CARDIOVERSION;  Surgeon: Mona Vinie BROCKS, MD;  Location: [facility] ENDOSCOPY;  Service: Cardiovascular;  Laterality: N/A;   COLONOSCOPY N/A    circa 2024 per Dr. Larene   EXTRACORPOREAL SHOCK WAVE LITHOTRIPSY Left 03/28/2017   Procedure: LEFT EXTRACORPOREAL SHOCK WAVE LITHOTRIPSY (ESWL);  Surgeon: Matilda Senior, MD;  Location: [facility] ORS;  Service: Urology;  Laterality: Left;   LITHOTRIPSY     STENT PLACE LEFT URETER ([facility] HX) Bilateral     Home Medications:  Medications Prior to Admission  Medication Sig Dispense Refill Last Dose/Taking   cyanocobalamin  (VITAMIN B12) 500 MCG tablet Take 500 mcg by mouth daily.   Taking   diltiazem  (CARDIZEM  CD) 120 MG 24 hr capsule TAKE 1 CAPSULE BY MOUTH EVERY DAY 90 capsule 1 Taking   loratadine (CLARITIN) 10 MG tablet Take 10 mg by mouth daily.   Taking    pravastatin  (PRAVACHOL ) 20 MG tablet TAKE 1 TABLET BY MOUTH EVERY DAY 90 tablet 3 Taking   Probiotic Product (ALIGN PO) Take 1 capsule by mouth daily.   Taking   tamsulosin  (FLOMAX ) 0.4 MG CAPS capsule Take 0.4 mg by mouth daily.   Taking   cetirizine  (ZYRTEC ) 10 MG tablet Take 1 tablet (10 mg total) by mouth daily. (Patient not taking: Reported on 09/04/2024) 30 tablet 11 Not Taking   fluticasone  (FLONASE ) 50 MCG/ACT nasal spray SPRAY 2 SPRAYS INTO EACH NOSTRIL EVERY DAY (Patient not taking: Reported on 09/04/2024) 48 mL 2 Not Taking   promethazine -dextromethorphan (PROMETHAZINE -DM) 6.25-15 MG/5ML syrup Take 5 mLs by mouth 4 (four) times daily as needed for cough. Do not use and drive - May make drowsy. (Patient not taking: Reported on 09/04/2024) 118 mL 0 Not Taking   Allergies: No Known Allergies  Family History  Problem Relation Age of Onset   Heart disease Father    Lung cancer Paternal Grandfather    Social History:  reports that he has been smoking cigars. He has never used smokeless tobacco. He reports that he does not drink alcohol and does not use drugs.  ROS: A complete review of systems was performed.  All systems are negative except for pertinent findings as noted. Review of Systems  All other systems reviewed and are negative.    Physical Exam:  Vital signs in last  24 hours:   General:  Alert and oriented, No acute distress HEENT: Normocephalic, atraumatic Cardiovascular: Regular rate and rhythm Lungs: Regular rate and effort Abdomen: Soft, nontender, nondistended, no abdominal masses Back: No CVA tenderness Extremities: No edema Neurologic: Grossly intact  Laboratory Data:  No results found for this or any previous visit (from the past 24 hours). No results found for this or any previous visit (from the past 240 hours). Creatinine: Recent Labs    09/08/24 1442  CREATININE 0.98    Impression/Assessment:  BPH -   Plan:  I discussed with the patient and  his wife the nature, potential benefits, risks and alternatives to robotic water  jet ablation of the prostate, including side effects of the proposed treatment, the likelihood of the patient achieving the goals of the procedure, and any potential problems that might occur during the procedure or recuperation. Again discussed expectations for flow vs storage symptoms. All questions answered. Patient elects to proceed.    Donnice Brooks 09/15/2024

## 2024-09-21 DIAGNOSIS — N401 Enlarged prostate with lower urinary tract symptoms: Secondary | ICD-10-CM | POA: Diagnosis not present

## 2024-09-21 DIAGNOSIS — R3914 Feeling of incomplete bladder emptying: Secondary | ICD-10-CM | POA: Diagnosis not present

## 2024-10-07 ENCOUNTER — Ambulatory Visit (HOSPITAL_BASED_OUTPATIENT_CLINIC_OR_DEPARTMENT_OTHER)
Admission: RE | Admit: 2024-10-07 | Discharge: 2024-10-07 | Disposition: A | Discharge status: Home / Self Care | Source: Ambulatory Visit | Attending: Family Medicine | Admitting: Family Medicine

## 2024-10-07 ENCOUNTER — Other Ambulatory Visit (HOSPITAL_BASED_OUTPATIENT_CLINIC_OR_DEPARTMENT_OTHER): Payer: Self-pay

## 2024-10-07 ENCOUNTER — Encounter (HOSPITAL_BASED_OUTPATIENT_CLINIC_OR_DEPARTMENT_OTHER): Payer: Self-pay

## 2024-10-07 VITALS — BP 111/76 | HR 71 | Temp 97.8°F | Resp 20

## 2024-10-07 DIAGNOSIS — Z9889 Other specified postprocedural states: Secondary | ICD-10-CM | POA: Diagnosis not present

## 2024-10-07 DIAGNOSIS — R319 Hematuria, unspecified: Secondary | ICD-10-CM | POA: Insufficient documentation

## 2024-10-07 DIAGNOSIS — R350 Frequency of micturition: Secondary | ICD-10-CM | POA: Diagnosis not present

## 2024-10-07 DIAGNOSIS — R300 Dysuria: Secondary | ICD-10-CM | POA: Insufficient documentation

## 2024-10-07 LAB — POCT URINE DIPSTICK
Bilirubin, UA: NEGATIVE
Glucose, UA: NEGATIVE mg/dL
Ketones, POC UA: NEGATIVE mg/dL
Nitrite, UA: NEGATIVE
Protein Ur, POC: 100 mg/dL — AB
Spec Grav, UA: 1.025 (ref 1.010–1.025)
Urobilinogen, UA: 0.2 U/dL
pH, UA: 5.5 (ref 5.0–8.0)

## 2024-10-07 MED ORDER — NITROFURANTOIN MONOHYD MACRO 100 MG PO CAPS
100.0000 mg | ORAL_CAPSULE | Freq: Two times a day (BID) | ORAL | 0 refills | Status: AC
  Filled 2024-10-07: qty 14, 7d supply, fill #0

## 2024-10-07 NOTE — ED Provider Notes (Signed)
 [name]    CSN: [id number] Arrival date & time: 10/07/24  1008      History   Chief Complaint Chief Complaint  Patient presents with   Urinary Frequency    I think I have a UTI that I would like to have looked atCan I see Dr Dottie for this? - Entered by patient    HPI Dennis [name] is a 58 y.o. male.   58 year old male who had Rezum prostate ablation on 09/15/2024.  He had a catheter for a day or 2 after the procedure but did really well and has done well since then.  He has been very pleased with how his urination has improved and he feels like he is able to get empty and he does not have to go in the middle of the night.  On 09/30/2024, he developed frequency of urination.  Since that time he has developed dysuria, hematuria, urgency and yet at the beginning of urination he also has some hesitancy.  He denies nausea, vomiting, constipation, diarrhea, abdominal pain, pelvic pain, fever.  He has tried to reach his urology office several times and he can leave a voicemail but he has not been able to speak to someone and he has not gotten any callbacks.  He is concerned he has an acute UTI and does not feel like he can wait to hear back from urology at this point.   Urinary Frequency Pertinent negatives include no chest pain and no abdominal pain.    Past Medical History:  Diagnosis Date   A-fib [facility])    Asthma    BPH (benign prostatic hyperplasia)    f/by [facility]   Dyslipidemia 08/08/2022   History of kidney stones    Obesity    Obstructive sleep apnea 05/30/2021   Stable on Bipap   Paroxysmal atrial fibrillation (HCC) 04/27/2021   (history of) f/by Dr. Bernie   Peripheral neuropathy    f/by [facility]   S/P ablation of atrial fibrillation 09/26/2021    Patient Active Problem List   Diagnosis Date Noted   BPH (benign prostatic hyperplasia) 06/03/2024   Umbilical hernia 06/03/2024   Obesity 06/03/2024   Encounter for wellness  examination in adult 03/02/2024   Dyslipidemia 08/08/2022   S/P ablation of atrial fibrillation 09/26/2021   Excessive daytime sleepiness 07/11/2021   Persistent atrial fibrillation (HCC)    Obstructive sleep apnea 05/30/2021   Asthma 05/24/2021   History of kidney stones 05/24/2021   Renal disorder 05/24/2021   Paroxysmal atrial fibrillation (HCC) 04/27/2021   SVT (supraventricular tachycardia) 04/07/2019   Fatigue 04/07/2019   Palpitations 10/24/2017   Chest pain in adult 10/24/2017    Past Surgical History:  Procedure Laterality Date   ATRIAL FIBRILLATION ABLATION N/A 07/13/2021   Procedure: ATRIAL FIBRILLATION ABLATION;  Surgeon: Inocencio Soyla Lunger, MD;  Location: [facility] INVASIVE CV LAB;  Service: Cardiovascular;  Laterality: N/A;   CARDIOVERSION N/A 06/19/2021   Procedure: CARDIOVERSION;  Surgeon: Mona Vinie BROCKS, MD;  Location: [facility] ENDOSCOPY;  Service: Cardiovascular;  Laterality: N/A;   COLONOSCOPY N/A    circa 2024 per Dr. Larene   EXTRACORPOREAL SHOCK WAVE LITHOTRIPSY Left 03/28/2017   Procedure: LEFT EXTRACORPOREAL SHOCK WAVE LITHOTRIPSY (ESWL);  Surgeon: Matilda Senior, MD;  Location: [facility] ORS;  Service: Urology;  Laterality: Left;   LITHOTRIPSY     STENT PLACE LEFT URETER ([facility] HX) Bilateral        Home Medications    Prior to Admission medications  Medication Sig Start Date End Date Taking? Authorizing Provider  nitrofurantoin , macrocrystal-monohydrate, (MACROBID ) 100 MG capsule Take 1 capsule (100 mg total) by mouth 2 (two) times daily for 7 days. 10/07/24 10/14/24 Yes Ival Domino, FNP  cyanocobalamin  (VITAMIN B12) 500 MCG tablet Take 500 mcg by mouth daily.    [provider]  diltiazem  (CARDIZEM  CD) 120 MG 24 hr capsule TAKE 1 CAPSULE BY MOUTH EVERY DAY 05/07/24   Carlin Delon BROCKS, NP  loratadine (CLARITIN) 10 MG tablet Take 10 mg by mouth daily.    [provider]  pravastatin  (PRAVACHOL ) 20 MG tablet TAKE 1 TABLET BY MOUTH EVERY DAY  05/25/24   Krasowski, Robert J, MD  Probiotic Product (ALIGN PO) Take 1 capsule by mouth daily.    [provider]  tamsulosin  (FLOMAX ) 0.4 MG CAPS capsule Take 0.4 mg by mouth daily. 01/19/23   [provider]    Family History Family History  Problem Relation Age of Onset   Heart disease Father    Lung cancer Paternal Grandfather     Social History Social History[1]   Allergies   Patient has no known allergies.   Review of Systems Review of Systems  Constitutional:  Negative for chills and fever.  HENT:  Negative for ear pain and sore throat.   Eyes:  Negative for pain and visual disturbance.  Respiratory:  Negative for cough.   Cardiovascular:  Negative for chest pain and palpitations.  Gastrointestinal:  Negative for abdominal pain, constipation, diarrhea, nausea and vomiting.  Genitourinary:  Positive for dysuria, frequency, hematuria and urgency.  Musculoskeletal:  Negative for arthralgias and back pain.  Skin:  Negative for color change and rash.  Neurological:  Negative for seizures and syncope.  All other systems reviewed and are negative.    Physical Exam Triage Vital Signs ED Triage Vitals  Encounter Vitals Group     BP 10/07/24 1046 111/76     Girls Systolic BP Percentile --      Girls Diastolic BP Percentile --      Boys Systolic BP Percentile --      Boys Diastolic BP Percentile --      Pulse Rate 10/07/24 1046 71     Resp 10/07/24 1046 20     Temp 10/07/24 1046 97.8 F (36.6 C)     Temp Source 10/07/24 1046 Oral     SpO2 10/07/24 1046 95 %     Weight --      Height --      Head Circumference --      Peak Flow --      Pain Score 10/07/24 1044 4     Pain Loc --      Pain Education --      Exclude from Growth Chart --    No data found.  Updated Vital Signs BP 111/76 (BP Location: Right Arm)   Pulse 71   Temp 97.8 F (36.6 C) (Oral)   Resp 20   SpO2 95%   Visual Acuity Right Eye Distance:   Left Eye Distance:    Bilateral Distance:    Right Eye Near:   Left Eye Near:    Bilateral Near:     Physical Exam Vitals and nursing note reviewed.  Constitutional:      General: He is not in acute distress.    Appearance: He is well-developed. He is not ill-appearing or toxic-appearing.  HENT:     Head: Normocephalic and atraumatic.     Right  Ear: Hearing, tympanic membrane, ear canal and external ear normal.     Left Ear: Hearing, tympanic membrane, ear canal and external ear normal.     Nose: No congestion or rhinorrhea.     Right Sinus: No maxillary sinus tenderness or frontal sinus tenderness.     Left Sinus: No maxillary sinus tenderness or frontal sinus tenderness.     Mouth/Throat:     Lips: Pink.     Mouth: Mucous membranes are moist.     Pharynx: Uvula midline. No oropharyngeal exudate or posterior oropharyngeal erythema.     Tonsils: No tonsillar exudate.  Eyes:     Conjunctiva/sclera: Conjunctivae normal.     Pupils: Pupils are equal, round, and reactive to light.  Cardiovascular:     Rate and Rhythm: Normal rate and regular rhythm.     Heart sounds: S1 normal and S2 normal. No murmur heard. Pulmonary:     Effort: Pulmonary effort is normal. No respiratory distress.     Breath sounds: Normal breath sounds. No decreased breath sounds, wheezing, rhonchi or rales.  Abdominal:     General: Bowel sounds are normal.     Palpations: Abdomen is soft.     Tenderness: There is abdominal tenderness (Mild) in the suprapubic area. There is no right CVA tenderness, left CVA tenderness, guarding or rebound. Negative signs include Murphy's sign, Rovsing's sign and McBurney's sign.  Musculoskeletal:        General: No swelling.     Cervical back: Neck supple.  Lymphadenopathy:     Head:     Right side of head: No submental, submandibular, tonsillar, preauricular or posterior auricular adenopathy.     Left side of head: No submental, submandibular, tonsillar, preauricular or posterior auricular  adenopathy.     Cervical: No cervical adenopathy.     Right cervical: No superficial cervical adenopathy.    Left cervical: No superficial cervical adenopathy.  Skin:    General: Skin is warm and dry.     Capillary Refill: Capillary refill takes less than 2 seconds.     Findings: No rash.  Neurological:     Mental Status: He is alert and oriented to person, place, and time.  Psychiatric:        Mood and Affect: Mood normal.      UC Treatments / Results  Labs (all labs ordered are listed, but only abnormal results are displayed) Labs Reviewed  POCT URINE DIPSTICK - Abnormal; Notable for the following components:      Result Value   Clarity, UA cloudy (*)    Blood, UA large (*)    Protein Ur, POC =100 (*)    Leukocytes, UA Moderate (2+) (*)    All other components within normal limits  URINE CULTURE    EKG   Radiology No results found.  Procedures Procedures (including critical care time)  Medications Ordered in UC Medications - No data to display  Initial Impression / Assessment and Plan / UC Course  I have reviewed the triage vital signs and the nursing notes.  Pertinent labs & imaging results that were available during my care of the patient were reviewed by me and considered in my medical decision making (see chart for details).  Plan of Care (see discharge instructions for additional patient precautions and education): Urinary frequency, dysuria, hematuria after prostate surgery on 09/15/2024:  Urinalysis is abnormal but does not clearly show infection.  Urine culture sent.  Will adjust the plan of care, if needed once  the urine culture results.   Nitrofurantoin , 100 mg, twice daily for 7 days.   Get plenty of fluids and rest.   Continue tamsulosin  0.4 mg nightly to help prostate flow. Follow-up with urology as planned.  Return here if needed.  I reviewed the plan of care with the patient and/or the patient's guardian.  The patient and/or guardian had time to  ask questions and acknowledged that the questions were answered.  Final Clinical Impressions(s) / UC Diagnoses   Final diagnoses:  Urinary frequency  History of prostate surgery  Hematuria, unspecified type  Dysuria     Discharge Instructions      Urinary frequency, dysuria, hematuria after prostate surgery on 09/15/2024: Urinalysis is abnormal but does not clearly show infection.  Urine culture sent.  Will adjust the plan of care, if needed once the urine culture results.  Nitrofurantoin , 100 mg, twice daily for 7 days.  Get plenty of fluids and rest.  Continue tamsulosin  0.4 mg nightly to help prostate flow.  Follow-up with urology as planned.  Return here if needed.     ED Prescriptions     Medication Sig Dispense Auth. Provider   nitrofurantoin , macrocrystal-monohydrate, (MACROBID ) 100 MG capsule Take 1 capsule (100 mg total) by mouth 2 (two) times daily for 7 days. 14 capsule Ival Domino, FNP      PDMP not reviewed this encounter.    [1]  Social History Tobacco Use   Smoking status: Some Days    Types: Cigars   Smokeless tobacco: Never   Tobacco comments:    Haven't smoke or drink since 04/24/2021  Vaping Use   Vaping status: Never Used  Substance Use Topics   Alcohol use: No    Comment: socially   Drug use: No     Ival Domino, FNP 10/07/24 1131

## 2024-10-07 NOTE — Discharge Instructions (Addendum)
 Urinary frequency, dysuria, hematuria after prostate surgery on 09/15/2024: Urinalysis is abnormal but does not clearly show infection.  Urine culture sent.  Will adjust the plan of care, if needed once the urine culture results.  Nitrofurantoin , 100 mg, twice daily for 7 days.  Get plenty of fluids and rest.  Continue tamsulosin  0.4 mg nightly to help prostate flow.  Follow-up with urology as planned.  Return here if needed.

## 2024-10-07 NOTE — ED Triage Notes (Signed)
 Pt c/o urinary frequency that has been getting worst for the last 7 days. He reports he recently had an ablation of his prostate but has been unable to reach his urologist for an apt. He is also having dysuria, hematuria, and urgency. Denies pelvic pain or back pain.

## 2024-10-08 ENCOUNTER — Ambulatory Visit (HOSPITAL_BASED_OUTPATIENT_CLINIC_OR_DEPARTMENT_OTHER): Payer: Self-pay | Admitting: Family Medicine

## 2024-10-08 LAB — URINE CULTURE: Culture: NO GROWTH

## 2024-10-08 NOTE — Progress Notes (Signed)
 Cultures negative.  Antibiotics not needed.  Patient updated via voicemail message and encouraged to stop the nitrofurantoin .  Follow-up with primary care, urology or return here if needed.

## 2024-11-17 ENCOUNTER — Telehealth: Payer: Self-pay | Admitting: *Deleted

## 2024-11-17 MED ORDER — DILTIAZEM HCL ER COATED BEADS 120 MG PO CP24: 120.0000 mg | ORAL_CAPSULE | Freq: Every day | ORAL | 0 refills | Status: AC

## 2024-11-17 NOTE — Telephone Encounter (Signed)
 Rx refill sent to pharmacy.
